# Patient Record
Sex: Female | Born: 1960 | Race: White | Hispanic: No | Marital: Married | Smoking: Former smoker
Health system: Southern US, Community
[De-identification: ages and names within clinical notes are randomized; demographics above are authoritative.]

## PROBLEM LIST (undated history)

## (undated) DIAGNOSIS — E079 Disorder of thyroid, unspecified: Secondary | ICD-10-CM

## (undated) DIAGNOSIS — S6990XA Unspecified injury of unspecified wrist, hand and finger(s), initial encounter: Secondary | ICD-10-CM

## (undated) DIAGNOSIS — E119 Type 2 diabetes mellitus without complications: Secondary | ICD-10-CM

## (undated) DIAGNOSIS — M109 Gout, unspecified: Secondary | ICD-10-CM

## (undated) DIAGNOSIS — I10 Essential (primary) hypertension: Secondary | ICD-10-CM

## (undated) HISTORY — PX: TONSILLECTOMY: SUR1361

## (undated) HISTORY — PX: BREAST SURGERY: SHX581

## (undated) HISTORY — DX: Gout, unspecified: M10.9

## (undated) HISTORY — DX: Unspecified injury of unspecified wrist, hand and finger(s), initial encounter: S69.90XA

## (undated) HISTORY — DX: Disorder of thyroid, unspecified: E07.9

## (undated) HISTORY — PX: ABDOMINAL HYSTERECTOMY: SHX81

---

## 2008-11-26 ENCOUNTER — Ambulatory Visit: Payer: Self-pay | Admitting: Internal Medicine

## 2009-02-10 ENCOUNTER — Ambulatory Visit: Payer: Self-pay | Admitting: Surgery

## 2009-05-08 ENCOUNTER — Ambulatory Visit: Payer: Self-pay | Admitting: Surgery

## 2009-09-08 ENCOUNTER — Ambulatory Visit: Payer: Self-pay | Admitting: Surgery

## 2010-02-18 ENCOUNTER — Ambulatory Visit: Payer: Self-pay | Admitting: Surgery

## 2011-08-16 ENCOUNTER — Ambulatory Visit: Payer: Self-pay | Admitting: Internal Medicine

## 2011-08-16 LAB — FERRITIN: Ferritin (ARMC): 117 ng/mL (ref 8–388)

## 2011-08-16 LAB — CBC CANCER CENTER
Basophil #: 0.1 x10 3/mm (ref 0.0–0.1)
Eosinophil #: 0.2 x10 3/mm (ref 0.0–0.7)
Eosinophil %: 1.6 %
HCT: 49.3 % — ABNORMAL HIGH (ref 35.0–47.0)
HGB: 16.2 g/dL — ABNORMAL HIGH (ref 12.0–16.0)
Lymphocyte #: 1.9 x10 3/mm (ref 1.0–3.6)
Lymphocyte %: 15 %
MCH: 31 pg (ref 26.0–34.0)
MCHC: 32.9 g/dL (ref 32.0–36.0)
Monocyte #: 1.2 x10 3/mm — ABNORMAL HIGH (ref 0.2–0.9)
Neutrophil #: 9.3 x10 3/mm — ABNORMAL HIGH (ref 1.4–6.5)
Neutrophil %: 73.6 %
Platelet: 192 x10 3/mm (ref 150–440)
RBC: 5.25 10*6/uL — ABNORMAL HIGH (ref 3.80–5.20)
RDW: 13.9 % (ref 11.5–14.5)

## 2011-08-16 LAB — IRON AND TIBC
Iron Bind.Cap.(Total): 434 ug/dL (ref 250–450)
Iron: 97 ug/dL (ref 50–170)
Unbound Iron-Bind.Cap.: 337 ug/dL

## 2011-08-17 ENCOUNTER — Ambulatory Visit: Payer: Self-pay | Admitting: Internal Medicine

## 2011-08-23 LAB — CANCER CENTER HEMATOCRIT: HCT: 45.7 % (ref 35.0–47.0)

## 2011-09-17 ENCOUNTER — Ambulatory Visit: Payer: Self-pay | Admitting: Internal Medicine

## 2011-09-20 LAB — CBC CANCER CENTER
Basophil %: 0.9 %
Eosinophil #: 0.2 x10 3/mm (ref 0.0–0.7)
Eosinophil %: 1.4 %
HCT: 46.3 % (ref 35.0–47.0)
HGB: 15.5 g/dL (ref 12.0–16.0)
Lymphocyte #: 2.7 x10 3/mm (ref 1.0–3.6)
MCH: 31.3 pg (ref 26.0–34.0)
MCHC: 33.4 g/dL (ref 32.0–36.0)
MCV: 94 fL (ref 80–100)
Monocyte #: 1.1 x10 3/mm — ABNORMAL HIGH (ref 0.2–0.9)
Neutrophil #: 6.7 x10 3/mm — ABNORMAL HIGH (ref 1.4–6.5)
Platelet: 243 x10 3/mm (ref 150–440)
RBC: 4.95 10*6/uL (ref 3.80–5.20)

## 2011-10-17 ENCOUNTER — Ambulatory Visit: Payer: Self-pay | Admitting: Internal Medicine

## 2011-10-18 LAB — CBC CANCER CENTER
Basophil %: 0.8 %
Eosinophil %: 2.1 %
HCT: 41.7 % (ref 35.0–47.0)
Lymphocyte #: 1.6 x10 3/mm (ref 1.0–3.6)
Lymphocyte %: 16.2 %
MCH: 31.2 pg (ref 26.0–34.0)
MCV: 93 fL (ref 80–100)
Monocyte #: 1.1 x10 3/mm — ABNORMAL HIGH (ref 0.2–0.9)
Platelet: 231 x10 3/mm (ref 150–440)
RDW: 13 % (ref 11.5–14.5)

## 2011-11-17 ENCOUNTER — Ambulatory Visit: Payer: Self-pay | Admitting: Internal Medicine

## 2013-01-23 ENCOUNTER — Telehealth: Payer: Self-pay | Admitting: *Deleted

## 2013-01-23 ENCOUNTER — Encounter: Payer: Self-pay | Admitting: *Deleted

## 2013-01-23 ENCOUNTER — Ambulatory Visit: Payer: Self-pay | Admitting: Podiatry

## 2013-01-23 NOTE — Telephone Encounter (Signed)
May refill once until she follows up with [place].

## 2013-01-23 NOTE — Telephone Encounter (Signed)
RECEIVED FAX FOR PRESCRIPTION REFILL FOR AUGMENTIN 875MG 

## 2013-01-24 MED ORDER — AMOXICILLIN-POT CLAVULANATE 875-125 MG PO TABS: 1.0000 | ORAL_TABLET | Freq: Two times a day (BID) | ORAL | Status: DC

## 2013-01-24 NOTE — Telephone Encounter (Signed)
RX SENT ELECTRONICALLY  

## 2013-02-06 ENCOUNTER — Encounter: Payer: Self-pay | Admitting: Podiatry

## 2013-02-06 ENCOUNTER — Ambulatory Visit (INDEPENDENT_AMBULATORY_CARE_PROVIDER_SITE_OTHER): Payer: BC Managed Care – PPO | Admitting: Podiatry

## 2013-02-06 VITALS — BP 136/73 | HR 93 | Resp 16 | Ht 65.0 in | Wt 240.0 lb

## 2013-02-06 DIAGNOSIS — L03039 Cellulitis of unspecified toe: Secondary | ICD-10-CM

## 2013-02-06 MED ORDER — CLINDAMYCIN HCL 150 MG PO CAPS: 150.0000 mg | ORAL_CAPSULE | Freq: Three times a day (TID) | ORAL | Status: DC

## 2013-02-06 NOTE — Patient Instructions (Signed)
Discontinue Betadine and water soaks.  Start EPSOM salts and warm water twice daily for 20 minutes.  Continue cortisporin otic solution drops to each nail after soaking. Cover during the day and leave open and night.  Start Antibiotics and continue until finished.  We will follow up with you in three weeks.

## 2013-02-06 NOTE — Progress Notes (Signed)
Sarah Gilmore presents today for followup matrixectomy to the hallux bilateral. She states that they're still quite tender. She states that she has not been soaking them a regular basis. She was unable to come to her previous appointment due to our having to cancel her. She did take another round of antibiotics. However she has been without them for the past few days. He states that the right hallux is getting more red.  Objective: Vital signs are stable she is alert and oriented x3. Right hallux does demonstrate erythema no edema no cellulitis drainage or odor. Left demonstrates very nice matrixectomy's nice borders and no signs of infection.  Assessment: Slowly healing matrixectomy hallux bilateral.  Plan: We discussed etiology pathology conservative or surgical therapies. At this point I am prescribing clindamycin 150 mg 1 by mouth 3 times a day. She will continue to soak but in Epsom salts and water on a twice a day basis continued to apply the Cortisporin Otic. And I will followup with her in one month.

## 2013-02-27 ENCOUNTER — Ambulatory Visit (INDEPENDENT_AMBULATORY_CARE_PROVIDER_SITE_OTHER): Payer: BC Managed Care – PPO | Admitting: Podiatry

## 2013-02-27 ENCOUNTER — Encounter: Payer: Self-pay | Admitting: Podiatry

## 2013-02-27 VITALS — BP 131/66 | HR 89 | Resp 16 | Ht 65.0 in | Wt 240.0 lb

## 2013-02-27 DIAGNOSIS — L600 Ingrowing nail: Secondary | ICD-10-CM

## 2013-02-27 NOTE — Progress Notes (Signed)
Sarah Gilmore presents today concerned about a possible infection to toes #1 bilateral. She states that she's been soaking twice a day because she doesn't have the time do that. But she is concerned about the redness.  Objective: Vital signs are stable she is alert and oriented x3. Toes do demonstrate some mild erythema but appears LMB inflammation really not infection at this point.  Assessment: Well-healing matrixectomy hallux bilateral.  Plan: Continue to soak on a twice a day basis in Epsom salts and water no followup with her on an as-needed basis.

## 2013-06-19 ENCOUNTER — Other Ambulatory Visit: Payer: Self-pay | Admitting: Family Medicine

## 2013-06-19 LAB — BASIC METABOLIC PANEL
ANION GAP: 5 — AB (ref 7–16)
BUN: 9 mg/dL (ref 7–18)
CHLORIDE: 100 mmol/L (ref 98–107)
CO2: 29 mmol/L (ref 21–32)
Calcium, Total: 9.1 mg/dL (ref 8.5–10.1)
Creatinine: 0.7 mg/dL (ref 0.60–1.30)
EGFR (African American): >60
EGFR (Non-African Amer.): >60
Glucose: 231 mg/dL — ABNORMAL HIGH (ref 65–99)
OSMOLALITY: 274 (ref 275–301)
Potassium: 4.2 mmol/L (ref 3.5–5.1)
SODIUM: 134 mmol/L — AB (ref 136–145)

## 2013-06-19 LAB — LIPID PANEL
Cholesterol: 221 mg/dL — ABNORMAL HIGH (ref 0–200)
HDL Cholesterol: 37 mg/dL — ABNORMAL LOW (ref 40–60)
Ldl Cholesterol, Calc: 150 mg/dL — ABNORMAL HIGH (ref 0–100)
Triglycerides: 169 mg/dL (ref 0–200)
VLDL CHOLESTEROL, CALC: 34 mg/dL (ref 5–40)

## 2013-06-19 LAB — TSH: Thyroid Stimulating Horm: 18.4 u[IU]/mL — ABNORMAL HIGH

## 2013-06-19 LAB — HEMOGLOBIN A1C: Hemoglobin A1C: 8.7 % — ABNORMAL HIGH (ref 4.2–6.3)

## 2013-09-04 ENCOUNTER — Other Ambulatory Visit: Payer: Self-pay | Admitting: Family Medicine

## 2013-09-04 LAB — TSH: Thyroid Stimulating Horm: 9.17 u[IU]/mL — ABNORMAL HIGH

## 2013-11-28 ENCOUNTER — Other Ambulatory Visit: Payer: Self-pay

## 2013-11-28 LAB — TSH: THYROID STIMULATING HORM: 3.13 u[IU]/mL

## 2014-06-16 ENCOUNTER — Other Ambulatory Visit: Payer: Self-pay

## 2014-06-18 ENCOUNTER — Other Ambulatory Visit: Payer: Self-pay | Admitting: Family Medicine

## 2014-11-07 ENCOUNTER — Ambulatory Visit: Payer: Self-pay | Admitting: Family Medicine

## 2014-12-01 ENCOUNTER — Other Ambulatory Visit: Payer: Self-pay | Admitting: Family Medicine

## 2014-12-01 NOTE — Telephone Encounter (Signed)
Pt called states that she have staph infection again wanted to know if you would call in meds for this, pt also have appt on  August 31. Pt call back # is   [phone]

## 2014-12-02 MED ORDER — DOXYCYCLINE HYCLATE 100 MG PO TABS: 100.0000 mg | ORAL_TABLET | Freq: Two times a day (BID) | ORAL | Status: DC

## 2014-12-02 NOTE — Telephone Encounter (Signed)
Please call Sarah Gilmore to obtain the following information: where is the infection? Is it a boil/abscess? Ideally these do not need anti-biotics unless there is a large area of infection or high risk of transmitting them to others- works in healthcare, works with athletes, Catering manager. She can use warm compresses 3-4 times daily to try and drain and open the abscess. If warm compresses are not working, she will needed to be seen for the area to be lanced and drained.  Thanks! [place]

## 2014-12-02 NOTE — Telephone Encounter (Signed)
Patient notified of abx sent to her [facility] pharmacy.

## 2014-12-02 NOTE — Telephone Encounter (Signed)
In talking more with Sarah Gilmore- pt reports her husband is frequently admitted to hospital. She noticed boil in previous MRSA site after visiting in hospital.  I encourage warm compresses to have boil burst and heal to draw out infection. Given risk to husband- she does meet antibiotic criteria. She can take doxycycline  BID for 10 days. I will send it to the pharmacy on file. Please let her know.

## 2014-12-02 NOTE — Telephone Encounter (Signed)
Patient was informed of suggestions from A.Krebs. 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 She just wanted abx called in b/c Dr. Juanetta Gosling gave her one in Feb 2016. The boil is on the same rt forearm as the last time. I told her that she needs to do this to try and get the boil to bust. She does not want to come in earlier than appt previously scheduled.

## 2014-12-17 ENCOUNTER — Encounter: Payer: Self-pay | Admitting: Family Medicine

## 2014-12-17 ENCOUNTER — Ambulatory Visit (INDEPENDENT_AMBULATORY_CARE_PROVIDER_SITE_OTHER): Payer: BLUE CROSS/BLUE SHIELD | Admitting: Family Medicine

## 2014-12-17 VITALS — BP 109/71 | HR 76 | Resp 16 | Ht 65.0 in | Wt 253.8 lb

## 2014-12-17 DIAGNOSIS — E039 Hypothyroidism, unspecified: Secondary | ICD-10-CM | POA: Insufficient documentation

## 2014-12-17 DIAGNOSIS — E669 Obesity, unspecified: Secondary | ICD-10-CM

## 2014-12-17 DIAGNOSIS — R899 Unspecified abnormal finding in specimens from other organs, systems and tissues: Secondary | ICD-10-CM | POA: Insufficient documentation

## 2014-12-17 DIAGNOSIS — R748 Abnormal levels of other serum enzymes: Secondary | ICD-10-CM | POA: Diagnosis not present

## 2014-12-17 DIAGNOSIS — E119 Type 2 diabetes mellitus without complications: Secondary | ICD-10-CM | POA: Diagnosis not present

## 2014-12-17 DIAGNOSIS — E038 Other specified hypothyroidism: Secondary | ICD-10-CM

## 2014-12-17 LAB — POCT GLYCOSYLATED HEMOGLOBIN (HGB A1C): Hemoglobin A1C: 6.4

## 2014-12-17 NOTE — Progress Notes (Signed)
Name: Sarah Gilmore   MRN: [id number]    DOB: 01-26-1961   Date:12/17/2014       Progress Note  Subjective  Chief Complaint  Chief Complaint  Patient presents with  . Diabetes    HPI  HEee for f/u of T2DM, hypothyroidism, obesity, elevated liver enzymes..  Taking meds.  She has no specific c/o, except fatigue.  She has not made much attempt to lose weight.  No problem-specific assessment & plan notes found for this encounter.   Past Medical History  Diagnosis Date  . Thyroid disease   . Gout     Social History  Substance Use Topics  . Smoking status: Former Smoker    Types: Cigarettes    Quit date: 08/17/2001  . Smokeless tobacco: Never Used  . Alcohol Use: Yes     Comment: SOCIAL      Current outpatient prescriptions:  .  levothyroxine (SYNTHROID, LEVOTHROID) 125 MCG tablet, , Disp: , Rfl: 2 .  lisinopril (PRINIVIL,ZESTRIL) 5 MG tablet, , Disp: , Rfl: 5 .  metFORMIN (GLUCOPHAGE) 500 MG tablet, , Disp: , Rfl: 5  No Known Allergies  Review of Systems  Constitutional: Positive for malaise/fatigue. Negative for fever, chills and weight loss.  HENT: Negative for hearing loss.   Eyes: Negative for blurred vision and double vision.  Respiratory: Negative for cough, sputum production, shortness of breath and wheezing.   Cardiovascular: Negative for chest pain, orthopnea and leg swelling.  Gastrointestinal: Negative for heartburn, nausea, vomiting, abdominal pain, diarrhea and blood in stool.  Genitourinary: Negative for dysuria, urgency and frequency.  Musculoskeletal: Negative for myalgias and joint pain.  Skin: Negative for rash.  Neurological: Negative for dizziness, sensory change, focal weakness, weakness and headaches.      Objective  Filed Vitals:   12/17/14 1619  BP: 109/71  Pulse: 76  Resp: 16  Height:  (1.651 m)  Weight: 253 lb 12.8 oz (115.123 kg)     Physical Exam  Constitutional: She is oriented to person, place, and time and  well-developed, well-nourished, and in no distress. No distress.  HENT:  Head: Normocephalic and atraumatic.  Eyes: Conjunctivae and EOM are normal. Pupils are equal, round, and reactive to light. No scleral icterus.  Neck: Normal range of motion. Neck supple. Carotid bruit is not present. No thyromegaly present.  Cardiovascular: Normal rate, regular rhythm, normal heart sounds and intact distal pulses.  Exam reveals no gallop and no friction rub.   No murmur heard. Pulmonary/Chest: Effort normal and breath sounds normal. No respiratory distress. She has no wheezes. She has no rales.  Abdominal: Bowel sounds are normal. She exhibits no distension, no abdominal bruit and no mass. There is no tenderness.  Musculoskeletal: She exhibits no edema.  Lymphadenopathy:    She has no cervical adenopathy.  Neurological: She is alert and oriented to person, place, and time.  Vitals reviewed.     No results found for this or any previous visit (from the past 2160 hour(s)).   Assessment & Plan  1. Type 2 diabetes mellitus without complication  - POCT HgB A1C -6.4  2. Elevated liver enzymes  - Comprehensive Metabolic Panel (CMET)  3. Other specified hypothyroidism  - TSH  4. Obesity

## 2014-12-17 NOTE — Patient Instructions (Addendum)
Discussed calorie reduction and weight loss.  Continue current meds.

## 2014-12-22 ENCOUNTER — Other Ambulatory Visit
Admission: RE | Admit: 2014-12-22 | Discharge: 2014-12-22 | Disposition: A | Discharge status: Home / Self Care | Payer: BLUE CROSS/BLUE SHIELD | Source: Ambulatory Visit | Attending: Family Medicine | Admitting: Family Medicine

## 2014-12-22 DIAGNOSIS — R748 Abnormal levels of other serum enzymes: Secondary | ICD-10-CM | POA: Insufficient documentation

## 2014-12-22 DIAGNOSIS — E038 Other specified hypothyroidism: Secondary | ICD-10-CM | POA: Insufficient documentation

## 2014-12-22 LAB — COMPREHENSIVE METABOLIC PANEL
ALT: 100 U/L — ABNORMAL HIGH (ref 14–54)
ANION GAP: 8 (ref 5–15)
AST: 88 U/L — AB (ref 15–41)
Albumin: 4.2 g/dL (ref 3.5–5.0)
Alkaline Phosphatase: 72 U/L (ref 38–126)
BILIRUBIN TOTAL: 0.8 mg/dL (ref 0.3–1.2)
BUN: 13 mg/dL (ref 6–20)
CHLORIDE: 103 mmol/L (ref 101–111)
CO2: 27 mmol/L (ref 22–32)
Calcium: 9.5 mg/dL (ref 8.9–10.3)
Creatinine, Ser: 0.6 mg/dL (ref 0.44–1.00)
Glucose, Bld: 107 mg/dL — ABNORMAL HIGH (ref 65–99)
POTASSIUM: 4.5 mmol/L (ref 3.5–5.1)
Sodium: 138 mmol/L (ref 135–145)
TOTAL PROTEIN: 7.9 g/dL (ref 6.5–8.1)

## 2014-12-22 LAB — TSH: TSH: 2.296 u[IU]/mL (ref 0.350–4.500)

## 2015-02-12 ENCOUNTER — Other Ambulatory Visit: Payer: Self-pay | Admitting: Family Medicine

## 2015-02-12 DIAGNOSIS — Z1231 Encounter for screening mammogram for malignant neoplasm of breast: Secondary | ICD-10-CM

## 2015-02-17 ENCOUNTER — Ambulatory Visit: Payer: BLUE CROSS/BLUE SHIELD | Attending: Family Medicine

## 2015-03-23 ENCOUNTER — Encounter: Payer: Self-pay | Admitting: Family Medicine

## 2015-03-23 ENCOUNTER — Ambulatory Visit (INDEPENDENT_AMBULATORY_CARE_PROVIDER_SITE_OTHER): Payer: BLUE CROSS/BLUE SHIELD | Admitting: Family Medicine

## 2015-03-23 VITALS — BP 117/75 | HR 87 | Resp 16 | Ht 65.0 in | Wt 257.2 lb

## 2015-03-23 DIAGNOSIS — E119 Type 2 diabetes mellitus without complications: Secondary | ICD-10-CM | POA: Insufficient documentation

## 2015-03-23 DIAGNOSIS — R748 Abnormal levels of other serum enzymes: Secondary | ICD-10-CM | POA: Diagnosis not present

## 2015-03-23 DIAGNOSIS — L918 Other hypertrophic disorders of the skin: Secondary | ICD-10-CM

## 2015-03-23 LAB — POCT GLYCOSYLATED HEMOGLOBIN (HGB A1C): Hemoglobin A1C: 6.7

## 2015-03-23 NOTE — Progress Notes (Signed)
Name: Sarah Gilmore   MRN: [id number]    DOB: 10/10/1960   Date:03/23/2015       Progress Note  Subjective  Chief Complaint  Chief Complaint  Patient presents with  . Diabetes    6.4 on 12/17/2014    HPI Here for f/u of DM.  Not following diet well recently.  Eating more sweets recently.  Otherwise feels well.  No problem-specific assessment & plan notes found for this encounter.   Past Medical History  Diagnosis Date  . Thyroid disease   . Gout     Past Surgical History  Procedure Laterality Date  . Breast surgery Bilateral   . Abdominal hysterectomy    . Tonsillectomy      History reviewed. No pertinent family history.  Social History   Social History  . Marital Status: Married    Spouse Name: N/A  . Number of Children: N/A  . Years of Education: N/A   Occupational History  . Not on file.   Social History Main Topics  . Smoking status: Former Smoker    Types: Cigarettes    Quit date: 08/17/2001  . Smokeless tobacco: Never Used  . Alcohol Use: Yes     Comment: SOCIAL   . Drug Use: No  . Sexual Activity: Not on file   Other Topics Concern  . Not on file   Social History Narrative     Current outpatient prescriptions:  .  levothyroxine (SYNTHROID, LEVOTHROID) 125 MCG tablet, , Disp: , Rfl: 2 .  lisinopril (PRINIVIL,ZESTRIL) 5 MG tablet, , Disp: , Rfl: 5 .  metFORMIN (GLUCOPHAGE) 500 MG tablet, , Disp: , Rfl: 5  Not on File   Review of Systems  Constitutional: Negative for fever, chills, weight loss and malaise/fatigue.  HENT: Negative for hearing loss.   Eyes: Negative for blurred vision and double vision.  Respiratory: Negative for cough, shortness of breath and wheezing.   Cardiovascular: Negative for chest pain, palpitations and leg swelling.  Gastrointestinal: Negative for heartburn, abdominal pain and blood in stool.  Genitourinary: Negative for dysuria, urgency and frequency.  Musculoskeletal: Negative for myalgias and joint pain.   Skin: Negative for rash.  Neurological: Negative for dizziness, weakness and headaches.      Objective  Filed Vitals:   03/23/15 1601  BP: 117/75  Pulse: 87  Resp: 16  Height: 5\' 5"  (1.651 m)  Weight: 257 lb 3.2 oz (116.665 kg)    Physical Exam  Constitutional: She is oriented to person, place, and time and well-developed, well-nourished, and in no distress. No distress.  HENT:  Head: Normocephalic and atraumatic.  Eyes: Conjunctivae and EOM are normal. Pupils are equal, round, and reactive to light. No scleral icterus.  Neck: Normal range of motion. Neck supple. Carotid bruit is not present. No thyromegaly present.  Cardiovascular: Normal rate, regular rhythm and normal heart sounds.  Exam reveals no gallop and no friction rub.   No murmur heard. Pulmonary/Chest: Effort normal and breath sounds normal. No respiratory distress. She has no wheezes. She has no rales.  Abdominal: Soft. Bowel sounds are normal. She exhibits no distension, no abdominal bruit and no mass. There is no tenderness.  Musculoskeletal: She exhibits no edema.  Lymphadenopathy:    She has no cervical adenopathy.  Neurological: She is alert and oriented to person, place, and time.  Skin:  Multiple skin tags in each axilla.       Recent Results (from the past 2160 hour(s))  POCT HgB A1C  Status: Abnormal   Collection Time: 03/23/15  4:07 PM  Result Value Ref Range   Hemoglobin A1C 6.7      Assessment & Plan  Problem List Items Addressed This Visit      Endocrine   DM (diabetes mellitus) (HCC) - Primary   Relevant Orders   POCT HgB A1C (Completed)   Comprehensive Metabolic Panel (CMET)     Other   Elevated liver enzymes   Relevant Orders   Comprehensive Metabolic Panel (CMET)    Other Visit Diagnoses    Cutaneous skin tags        Relevant Orders    Ambulatory referral to Dermatology       No orders of the defined types were placed in this encounter.   1. Type 2 diabetes  mellitus without complication, unspecified long term insulin use status (HCC)  - POCT HgB A1C-6.8 - Comprehensive Metabolic Panel (CMET)  2. Elevated liver enzymes  - Comprehensive Metabolic Panel (CMET)  3. Cutaneous skin tags  - Ambulatory referral to Dermatology

## 2015-03-25 ENCOUNTER — Other Ambulatory Visit
Admission: RE | Admit: 2015-03-25 | Discharge: 2015-03-25 | Disposition: A | Discharge status: Home / Self Care | Payer: BLUE CROSS/BLUE SHIELD | Source: Ambulatory Visit | Attending: Family Medicine | Admitting: Family Medicine

## 2015-03-25 ENCOUNTER — Telehealth: Payer: Self-pay | Admitting: *Deleted

## 2015-03-25 DIAGNOSIS — R748 Abnormal levels of other serum enzymes: Secondary | ICD-10-CM | POA: Insufficient documentation

## 2015-03-25 LAB — COMPREHENSIVE METABOLIC PANEL
ALBUMIN: 3.7 g/dL (ref 3.5–5.0)
ALK PHOS: 79 U/L (ref 38–126)
ALT: 106 U/L — AB (ref 14–54)
ANION GAP: 4 — AB (ref 5–15)
AST: 85 U/L — ABNORMAL HIGH (ref 15–41)
BUN: 20 mg/dL (ref 6–20)
CALCIUM: 9 mg/dL (ref 8.9–10.3)
CHLORIDE: 105 mmol/L (ref 101–111)
CO2: 29 mmol/L (ref 22–32)
CREATININE: 0.58 mg/dL (ref 0.44–1.00)
GFR calc non Af Amer: >60 mL/min (ref >60)
Glucose, Bld: 155 mg/dL — ABNORMAL HIGH (ref 65–99)
Potassium: 4.3 mmol/L (ref 3.5–5.1)
SODIUM: 138 mmol/L (ref 135–145)
Total Bilirubin: 0.8 mg/dL (ref 0.3–1.2)
Total Protein: 7.4 g/dL (ref 6.5–8.1)

## 2015-03-25 NOTE — Telephone Encounter (Signed)
Call her and set up an appointment to see me for face to face discussion about her abnormal liver enzymes.

## 2015-03-25 NOTE — Telephone Encounter (Signed)
[facility] was not able to add on Hep C. Liver Ultrasound was scheduled, patient declined US. She does not want to go back and have blood drawn either.[place]

## 2015-03-26 NOTE — Telephone Encounter (Signed)
Left detailed message noted below.Sarah Gilmore

## 2015-03-30 ENCOUNTER — Ambulatory Visit: Payer: BLUE CROSS/BLUE SHIELD

## 2015-04-01 ENCOUNTER — Ambulatory Visit: Payer: BLUE CROSS/BLUE SHIELD

## 2015-04-08 ENCOUNTER — Encounter: Payer: Self-pay | Admitting: *Deleted

## 2015-04-15 ENCOUNTER — Telehealth: Payer: Self-pay | Admitting: *Deleted

## 2015-04-15 NOTE — Telephone Encounter (Signed)
Patient was given the option between [facility] or [facility]. [facility] can see patient in February but [facility] can't see patient until 07/09/15. Patient wishes to go to sooner appointment.

## 2015-05-07 ENCOUNTER — Telehealth: Payer: Self-pay | Admitting: Family Medicine

## 2015-05-07 DIAGNOSIS — E119 Type 2 diabetes mellitus without complications: Secondary | ICD-10-CM

## 2015-05-07 MED ORDER — GLUCOSE BLOOD VI STRP: ORAL_STRIP | Status: AC

## 2015-05-07 NOTE — Telephone Encounter (Signed)
rx sent

## 2015-05-07 NOTE — Telephone Encounter (Signed)
Pt needs a new prescription for Contour Next test strips sent to [facility].

## 2015-05-14 ENCOUNTER — Ambulatory Visit: Payer: BLUE CROSS/BLUE SHIELD | Admitting: Family Medicine

## 2015-05-27 ENCOUNTER — Telehealth: Payer: Self-pay | Admitting: Family Medicine

## 2015-05-27 NOTE — Telephone Encounter (Signed)
Left detailed message.   

## 2015-05-27 NOTE — Telephone Encounter (Signed)
Pt asked for dr's name and address of dermatologist for appt scheduled on the 22nd.  She said you could just leaved a vm [phone]

## 2015-06-08 ENCOUNTER — Encounter: Payer: Self-pay | Admitting: Family Medicine

## 2015-06-08 ENCOUNTER — Ambulatory Visit (INDEPENDENT_AMBULATORY_CARE_PROVIDER_SITE_OTHER): Payer: BLUE CROSS/BLUE SHIELD | Admitting: Family Medicine

## 2015-06-08 VITALS — BP 135/75 | HR 73 | Resp 16 | Ht 65.0 in | Wt 263.4 lb

## 2015-06-08 DIAGNOSIS — E119 Type 2 diabetes mellitus without complications: Secondary | ICD-10-CM

## 2015-06-08 DIAGNOSIS — I10 Essential (primary) hypertension: Secondary | ICD-10-CM

## 2015-06-08 DIAGNOSIS — E038 Other specified hypothyroidism: Secondary | ICD-10-CM

## 2015-06-08 MED ORDER — METFORMIN HCL 1000 MG PO TABS: 1000.0000 mg | ORAL_TABLET | Freq: Two times a day (BID) | ORAL | Status: DC

## 2015-06-08 NOTE — Progress Notes (Signed)
Name: Sarah Gilmore   MRN: [id number]    DOB: 03-13-1961   Date:06/08/2015       Progress Note  Subjective  Chief Complaint  Chief Complaint  Patient presents with  . Hypertension    needs note not to wear steel toe boots due to foot issues. She also wants ref to bariatric clinic.   . Diabetes    Hypertension Associated symptoms include shortness of breath (with activity). Pertinent negatives include no blurred vision, chest pain, headaches, malaise/fatigue or palpitations.  Diabetes Pertinent negatives for hypoglycemia include no dizziness, headaches or tremors. Pertinent negatives for diabetes include no blurred vision, no chest pain, no weakness and no weight loss.   Here for f/u of HBP and DM.  Also hypothyroid.   Taking all meds as directed.  C/o feet swelling and painful when she wears steel toed boots/shoes at work.  Needs note not to wear steel toes.  BSs at home 120s-130s fasting.  Checks several days a week.    Says that she cannot eat less than she is eating.  She is still gaining weight and getting more and more SOB . No problem-specific assessment & plan notes found for this encounter.   Past Medical History  Diagnosis Date  . Thyroid disease   . Gout   . Wrist injury     Past Surgical History  Procedure Laterality Date  . Breast surgery Bilateral   . Abdominal hysterectomy    . Tonsillectomy      Family History  Problem Relation Age of Onset  . Diabetes Father   . Hypertension Father     Social History   Social History  . Marital Status: Married    Spouse Name: N/A  . Number of Children: N/A  . Years of Education: N/A   Occupational History  . Not on file.   Social History Main Topics  . Smoking status: Former Smoker    Types: Cigarettes    Quit date: 08/17/2001  . Smokeless tobacco: Never Used  . Alcohol Use: Yes     Comment: SOCIAL   . Drug Use: No  . Sexual Activity: Not on file   Other Topics Concern  . Not on file   Social  History Narrative     Current outpatient prescriptions:  .  glucose blood (BAYER CONTOUR NEXT TEST) test strip, Use as instructed, Disp: 100 each, Rfl: 12 .  levothyroxine (SYNTHROID, LEVOTHROID) 125 MCG tablet, , Disp: , Rfl: 2 .  lisinopril (PRINIVIL,ZESTRIL) 5 MG tablet, , Disp: , Rfl: 5 .  metFORMIN (GLUCOPHAGE) 1000 MG tablet, Take 1 tablet (1,000 mg total) by mouth 2 (two) times daily with a meal., Disp: 60 tablet, Rfl: 12  Not on File   Review of Systems  Constitutional: Negative for fever, chills, weight loss and malaise/fatigue.  HENT: Negative for hearing loss.   Eyes: Negative for blurred vision and double vision.  Respiratory: Positive for shortness of breath (with activity). Negative for cough and wheezing.   Cardiovascular: Negative for chest pain, palpitations and leg swelling.  Gastrointestinal: Negative for heartburn, abdominal pain and constipation.  Genitourinary: Negative for dysuria, urgency and frequency.  Musculoskeletal: Negative for myalgias and joint pain.  Skin: Negative for rash.  Neurological: Negative for dizziness, tremors, weakness and headaches.  Psychiatric/Behavioral: Negative for depression.      Objective  Filed Vitals:   06/08/15 1621 06/08/15 1648  BP: 122/64 135/75  Pulse: 73   Resp: 16   Height: 5' 5"  (  1.651 m)   Weight: 263 lb 6.4 oz (119.477 kg)   SpO2: 91%     Physical Exam  Constitutional: She is oriented to person, place, and time and well-developed, well-nourished, and in no distress. No distress.  HENT:  Head: Normocephalic and atraumatic.  Eyes: Conjunctivae and EOM are normal. Pupils are equal, round, and reactive to light. No scleral icterus.  Neck: Normal range of motion. Neck supple. Carotid bruit is not present. No thyromegaly present.  Cardiovascular: Normal rate, regular rhythm and normal heart sounds.  Exam reveals no gallop and no friction rub.   No murmur heard. Pulmonary/Chest: Effort normal and breath sounds  normal. No respiratory distress. She has no wheezes. She has no rales.  Musculoskeletal: She exhibits no edema.  Lymphadenopathy:    She has no cervical adenopathy.  Neurological: She is alert and oriented to person, place, and time.  Vitals reviewed.      Recent Results (from the past 2160 hour(s))  POCT HgB A1C     Status: Abnormal   Collection Time: 03/23/15  4:07 PM  Result Value Ref Range   Hemoglobin A1C 6.7   Comprehensive metabolic panel     Status: Abnormal   Collection Time: 03/25/15  6:48 AM  Result Value Ref Range   Sodium 138 135 - 145 mmol/L   Potassium 4.3 3.5 - 5.1 mmol/L   Chloride 105 101 - 111 mmol/L   CO2 29 22 - 32 mmol/L   Glucose, Bld 155 (H) 65 - 99 mg/dL   BUN 20 6 - 20 mg/dL   Creatinine, Ser 0.58 0.44 - 1.00 mg/dL   Calcium 9.0 8.9 - 10.3 mg/dL   Total Protein 7.4 6.5 - 8.1 g/dL   Albumin 3.7 3.5 - 5.0 g/dL   AST 85 (H) 15 - 41 U/L   ALT 106 (H) 14 - 54 U/L   Alkaline Phosphatase 79 38 - 126 U/L   Total Bilirubin 0.8 0.3 - 1.2 mg/dL   GFR calc non Af Amer >60 >60 mL/min   GFR calc Af Amer >60 >60 mL/min    Comment: (NOTE) The eGFR has been calculated using the CKD EPI equation. This calculation has not been validated in all clinical situations. eGFR's persistently <60 mL/min signify possible Chronic Kidney Disease.    Anion gap 4 (L) 5 - 15     Assessment & Plan  Problem List Items Addressed This Visit      Cardiovascular and Mediastinum   HBP (high blood pressure) - Primary     Endocrine   Hypothyroidism   DM (diabetes mellitus) (HCC)   Relevant Medications   metFORMIN (GLUCOPHAGE) 1000 MG tablet      Meds ordered this encounter  Medications  . metFORMIN (GLUCOPHAGE) 1000 MG tablet    Sig: Take 1 tablet (1,000 mg total) by mouth 2 (two) times daily with a meal.    Dispense:  60 tablet    Refill:  12   1. Essential hypertension Cont. Lisinopril  2. Type 2 diabetes mellitus without complication, without long-term current  use of insulin (HCC)  - metFORMIN (GLUCOPHAGE) 1000 MG tablet; Take 1 tablet (1,000 mg total) by mouth 2 (two) times daily with a meal.  Dispense: 60 tablet; Refill: 12  3. Other specified hypothyroidism Cont. . Levothyroxine

## 2015-06-18 ENCOUNTER — Telehealth: Payer: Self-pay | Admitting: Family Medicine

## 2015-06-18 ENCOUNTER — Other Ambulatory Visit: Payer: Self-pay | Admitting: Family Medicine

## 2015-06-18 DIAGNOSIS — E119 Type 2 diabetes mellitus without complications: Secondary | ICD-10-CM

## 2015-06-18 MED ORDER — LISINOPRIL 5 MG PO TABS: 5.0000 mg | ORAL_TABLET | Freq: Every day | ORAL | Status: AC

## 2015-06-18 MED ORDER — METFORMIN HCL 1000 MG PO TABS: 1000.0000 mg | ORAL_TABLET | Freq: Two times a day (BID) | ORAL | Status: DC

## 2015-06-18 NOTE — Telephone Encounter (Signed)
Done.[place] 

## 2015-06-18 NOTE — Telephone Encounter (Signed)
Pt needs refills on metformin and lisinopril sent to [facility] [address]  Her call back number is [phone]

## 2015-07-23 ENCOUNTER — Telehealth: Payer: Self-pay | Admitting: Family Medicine

## 2015-07-23 ENCOUNTER — Other Ambulatory Visit: Payer: Self-pay | Admitting: Family Medicine

## 2015-07-23 DIAGNOSIS — E039 Hypothyroidism, unspecified: Secondary | ICD-10-CM

## 2015-07-23 DIAGNOSIS — R748 Abnormal levels of other serum enzymes: Secondary | ICD-10-CM

## 2015-07-23 DIAGNOSIS — E1142 Type 2 diabetes mellitus with diabetic polyneuropathy: Secondary | ICD-10-CM

## 2015-07-23 DIAGNOSIS — I10 Essential (primary) hypertension: Secondary | ICD-10-CM

## 2015-07-23 DIAGNOSIS — R899 Unspecified abnormal finding in specimens from other organs, systems and tissues: Secondary | ICD-10-CM

## 2015-07-23 NOTE — Telephone Encounter (Signed)
Pt had an US scheduled for December but she had to cancel.  She would like to get that rescheduled and also get a lab order.  Her call back number is [phone]

## 2015-07-23 NOTE — Telephone Encounter (Signed)
I guess she is refering to US of liver she needs.  Can schedule Liver US at her convenience.  Also can order CMP, CBC, Lipid panel to be done fasting.-jhj

## 2015-07-24 NOTE — Telephone Encounter (Signed)
Left message notifying patient she may have labs drawn. I need to know when she would like to have U/S done?

## 2015-08-03 ENCOUNTER — Ambulatory Visit: Payer: BLUE CROSS/BLUE SHIELD | Admitting: Family Medicine

## 2015-08-04 ENCOUNTER — Other Ambulatory Visit
Admission: RE | Admit: 2015-08-04 | Discharge: 2015-08-04 | Disposition: A | Discharge status: Home / Self Care | Payer: BLUE CROSS/BLUE SHIELD | Source: Ambulatory Visit | Attending: Family Medicine | Admitting: Family Medicine

## 2015-08-04 ENCOUNTER — Ambulatory Visit
Admission: RE | Admit: 2015-08-04 | Discharge: 2015-08-04 | Disposition: A | Discharge status: Home / Self Care | Payer: BLUE CROSS/BLUE SHIELD | Source: Ambulatory Visit | Attending: Family Medicine | Admitting: Family Medicine

## 2015-08-04 ENCOUNTER — Other Ambulatory Visit: Payer: Self-pay | Admitting: Family Medicine

## 2015-08-04 DIAGNOSIS — R748 Abnormal levels of other serum enzymes: Secondary | ICD-10-CM | POA: Diagnosis present

## 2015-08-04 DIAGNOSIS — R899 Unspecified abnormal finding in specimens from other organs, systems and tissues: Secondary | ICD-10-CM

## 2015-08-04 LAB — LIPID PANEL
CHOL/HDL RATIO: 3.9 ratio
Cholesterol: 211 mg/dL — ABNORMAL HIGH (ref 0–200)
HDL: 54 mg/dL (ref >40)
LDL CALC: 132 mg/dL — AB (ref 0–99)
Triglycerides: 127 mg/dL (ref <150)
VLDL: 25 mg/dL (ref 0–40)

## 2015-08-04 LAB — CBC WITH DIFFERENTIAL/PLATELET
BASOS ABS: 0.1 10*3/uL (ref 0–0.1)
BASOS PCT: 1 %
EOS ABS: 0.2 10*3/uL (ref 0–0.7)
Eosinophils Relative: 2 %
HCT: 43.3 % (ref 35.0–47.0)
HEMOGLOBIN: 14.1 g/dL (ref 12.0–16.0)
Lymphocytes Relative: 24 %
Lymphs Abs: 1.8 10*3/uL (ref 1.0–3.6)
MCH: 28.4 pg (ref 26.0–34.0)
MCHC: 32.6 g/dL (ref 32.0–36.0)
MCV: 87.1 fL (ref 80.0–100.0)
Monocytes Absolute: 0.8 10*3/uL (ref 0.2–0.9)
Monocytes Relative: 10 %
NEUTROS PCT: 63 %
Neutro Abs: 4.8 10*3/uL (ref 1.4–6.5)
Platelets: 131 10*3/uL — ABNORMAL LOW (ref 150–440)
RBC: 4.97 MIL/uL (ref 3.80–5.20)
RDW: 15.2 % — ABNORMAL HIGH (ref 11.5–14.5)
WBC: 7.7 10*3/uL (ref 3.6–11.0)

## 2015-08-04 LAB — COMPREHENSIVE METABOLIC PANEL
ALBUMIN: 3.4 g/dL — AB (ref 3.5–5.0)
ALK PHOS: 94 U/L (ref 38–126)
ALT: 97 U/L — AB (ref 14–54)
ANION GAP: 4 — AB (ref 5–15)
AST: 109 U/L — AB (ref 15–41)
BUN: 15 mg/dL (ref 6–20)
CALCIUM: 8.7 mg/dL — AB (ref 8.9–10.3)
CO2: 29 mmol/L (ref 22–32)
Chloride: 103 mmol/L (ref 101–111)
Creatinine, Ser: 0.47 mg/dL (ref 0.44–1.00)
GFR calc Af Amer: >60 mL/min (ref >60)
GFR calc non Af Amer: >60 mL/min (ref >60)
GLUCOSE: 149 mg/dL — AB (ref 65–99)
Potassium: 4 mmol/L (ref 3.5–5.1)
SODIUM: 136 mmol/L (ref 135–145)
Total Bilirubin: 0.7 mg/dL (ref 0.3–1.2)
Total Protein: 7.3 g/dL (ref 6.5–8.1)

## 2015-08-04 LAB — TSH: TSH: 6.44 u[IU]/mL — ABNORMAL HIGH (ref 0.350–4.500)

## 2015-08-04 MED ORDER — LEVOTHYROXINE SODIUM 137 MCG PO TABS: 137.0000 ug | ORAL_TABLET | Freq: Every day | ORAL | Status: DC

## 2015-09-03 ENCOUNTER — Telehealth: Payer: Self-pay | Admitting: Family Medicine

## 2015-09-03 NOTE — Telephone Encounter (Signed)
Left detailed message with recommendations.[place] 

## 2015-09-03 NOTE — Telephone Encounter (Signed)
  Shouldn't treat with diuretic until I see her to evaluate other things.  Recommend markedly reducing salt intake and resting with feet elevated.  Keep appt next week-jh

## 2015-09-03 NOTE — Telephone Encounter (Signed)
Pt said her feet and hands are swelling really bad.  She has an appt scheduled for Tuesday but she asked if there was something she could do or anything he could call in for her.  Please call [phone]

## 2015-09-08 ENCOUNTER — Ambulatory Visit: Payer: BLUE CROSS/BLUE SHIELD | Admitting: Family Medicine

## 2015-10-06 ENCOUNTER — Ambulatory Visit (INDEPENDENT_AMBULATORY_CARE_PROVIDER_SITE_OTHER): Payer: BLUE CROSS/BLUE SHIELD | Admitting: Family Medicine

## 2015-10-06 ENCOUNTER — Encounter: Payer: Self-pay | Admitting: Family Medicine

## 2015-10-06 VITALS — BP 135/60 | HR 77 | Temp 98.6°F | Resp 16 | Ht 65.0 in | Wt 274.0 lb

## 2015-10-06 DIAGNOSIS — E119 Type 2 diabetes mellitus without complications: Secondary | ICD-10-CM | POA: Diagnosis not present

## 2015-10-06 DIAGNOSIS — E034 Atrophy of thyroid (acquired): Secondary | ICD-10-CM | POA: Diagnosis not present

## 2015-10-06 DIAGNOSIS — E038 Other specified hypothyroidism: Secondary | ICD-10-CM | POA: Diagnosis not present

## 2015-10-06 DIAGNOSIS — I10 Essential (primary) hypertension: Secondary | ICD-10-CM | POA: Diagnosis not present

## 2015-10-06 DIAGNOSIS — R748 Abnormal levels of other serum enzymes: Secondary | ICD-10-CM

## 2015-10-06 DIAGNOSIS — E0800 Diabetes mellitus due to underlying condition with hyperosmolarity without nonketotic hyperglycemic-hyperosmolar coma (NKHHC): Secondary | ICD-10-CM

## 2015-10-06 LAB — POCT GLYCOSYLATED HEMOGLOBIN (HGB A1C)

## 2015-10-06 MED ORDER — CANAGLIFLOZIN 300 MG PO TABS: 300.0000 mg | ORAL_TABLET | Freq: Every day | ORAL | Status: DC

## 2015-10-06 NOTE — Progress Notes (Signed)
Name: Sarah Gilmore   MRN: [id number]    DOB: 1960/07/14   Date:10/06/2015       Progress Note  Subjective  Chief Complaint  Chief Complaint  Patient presents with  . Diabetes  . Hypothyroidism    HPI Here for f/u of DM.  She says limited BS readings are always <150.   But she has gained 11# since last visit and says she has to urinate "all the time" and her feet are swollen.    She increased her Levothyroxine to 137 mcg daily after last lab 2 months ago and needs recheck of this.  No problem-specific assessment & plan notes found for this encounter.   Past Medical History  Diagnosis Date  . Thyroid disease   . Gout   . Wrist injury     Past Surgical History  Procedure Laterality Date  . Breast surgery Bilateral   . Abdominal hysterectomy    . Tonsillectomy      Family History  Problem Relation Age of Onset  . Diabetes Father   . Hypertension Father     Social History   Social History  . Marital Status: Married    Spouse Name: N/A  . Number of Children: N/A  . Years of Education: N/A   Occupational History  . Not on file.   Social History Main Topics  . Smoking status: Former Smoker    Types: Cigarettes    Quit date: 08/17/2001  . Smokeless tobacco: Never Used  . Alcohol Use: Yes     Comment: SOCIAL   . Drug Use: No  . Sexual Activity: Not on file   Other Topics Concern  . Not on file   Social History Narrative     Current outpatient prescriptions:  .  glucose blood (BAYER CONTOUR NEXT TEST) test strip, Use as instructed, Disp: 100 each, Rfl: 12 .  levothyroxine (LEVOTHROID) 137 MCG tablet, Take 1 tablet (137 mcg total) by mouth daily before breakfast., Disp: 30 tablet, Rfl: 6 .  lisinopril (PRINIVIL,ZESTRIL) 5 MG tablet, Take 1 tablet (5 mg total) by mouth daily., Disp: 30 tablet, Rfl: 6 .  metFORMIN (GLUCOPHAGE) 1000 MG tablet, Take 1 tablet (1,000 mg total) by mouth 2 (two) times daily with a meal., Disp: 60 tablet, Rfl: 12 .   canagliflozin (INVOKANA) 300 MG TABS tablet, Take 1 tablet (300 mg total) by mouth daily before breakfast., Disp: 30 tablet, Rfl: 6  Not on File   Review of Systems  Constitutional: Negative for fever, chills, weight loss and malaise/fatigue.  HENT: Negative for hearing loss.   Eyes: Negative for blurred vision and double vision.  Respiratory: Negative for cough, shortness of breath and wheezing.   Cardiovascular: Positive for leg swelling. Negative for chest pain and palpitations.  Gastrointestinal: Negative for heartburn, abdominal pain and blood in stool.  Genitourinary: Positive for urgency and frequency. Negative for dysuria.  Skin: Negative for rash.  Neurological: Negative for dizziness, tremors, weakness and headaches.  Psychiatric/Behavioral: Negative for depression. The patient has insomnia.       Objective  Filed Vitals:   10/06/15 1603 10/06/15 1709  BP: 125/51 135/60  Pulse: 77   Temp: 98.6 F (37 C)   TempSrc: Oral   Resp: 16   Height: _0  (1.651 m)   Weight: 274 lb (124.286 kg)     Physical Exam  Constitutional: She is oriented to person, place, and time and well-developed, well-nourished, and in no distress. No distress.  HENT:  Head: Normocephalic and atraumatic.  Eyes: Conjunctivae and EOM are normal. Pupils are equal, round, and reactive to light. No scleral icterus.  Neck: Normal range of motion. Neck supple. Carotid bruit is not present. No thyromegaly present.  Cardiovascular: Normal rate, regular rhythm and normal heart sounds.  Exam reveals no gallop and no friction rub.   No murmur heard. Pulmonary/Chest: Effort normal and breath sounds normal. No respiratory distress. She has no wheezes. She has no rales.  Musculoskeletal: She exhibits edema (2+ pedal edema of bilateral lower exremities.).  Lymphadenopathy:    She has no cervical adenopathy.  Neurological: She is alert and oriented to person, place, and time.  Vitals  reviewed.      Recent Results (from the past 2160 hour(s))  Lipid panel     Status: Abnormal   Collection Time: 08/04/15  6:50 AM  Result Value Ref Range   Cholesterol 211 (H) 0 - 200 mg/dL   Triglycerides 127 <150 mg/dL   HDL 54 >40 mg/dL   Total CHOL/HDL Ratio 3.9 RATIO   VLDL 25 0 - 40 mg/dL   LDL Cholesterol 132 (H) 0 - 99 mg/dL    Comment:        Total Cholesterol/HDL:CHD Risk Coronary Heart Disease Risk Table                     Men   Women  1/2 Average Risk   3.4   3.3  Average Risk       5.0   4.4  2 X Average Risk   9.6   7.1  3 X Average Risk  23.4   11.0        Use the calculated Patient Ratio above and the CHD Risk Table to determine the patient's CHD Risk.        ATP III CLASSIFICATION (LDL):  <100     mg/dL   Optimal  100-129  mg/dL   Near or Above                    Optimal  130-159  mg/dL   Borderline  160-189  mg/dL   High  >190     mg/dL   Very High   CBC with Differential/Platelet     Status: Abnormal   Collection Time: 08/04/15  6:50 AM  Result Value Ref Range   WBC 7.7 3.6 - 11.0 K/uL   RBC 4.97 3.80 - 5.20 MIL/uL   Hemoglobin 14.1 12.0 - 16.0 g/dL   HCT 43.3 35.0 - 47.0 %   MCV 87.1 80.0 - 100.0 fL   MCH 28.4 26.0 - 34.0 pg   MCHC 32.6 32.0 - 36.0 g/dL   RDW 15.2 (H) 11.5 - 14.5 %   Platelets 131 (L) 150 - 440 K/uL   Neutrophils Relative % 63 %   Neutro Abs 4.8 1.4 - 6.5 K/uL   Lymphocytes Relative 24 %   Lymphs Abs 1.8 1.0 - 3.6 K/uL   Monocytes Relative 10 %   Monocytes Absolute 0.8 0.2 - 0.9 K/uL   Eosinophils Relative 2 %   Eosinophils Absolute 0.2 0 - 0.7 K/uL   Basophils Relative 1 %   Basophils Absolute 0.1 0 - 0.1 K/uL  Comprehensive metabolic panel     Status: Abnormal   Collection Time: 08/04/15  6:50 AM  Result Value Ref Range   Sodium 136 135 - 145 mmol/L   Potassium 4.0 3.5 - 5.1 mmol/L   Chloride  103 101 - 111 mmol/L   CO2 29 22 - 32 mmol/L   Glucose, Bld 149 (H) 65 - 99 mg/dL   BUN 15 6 - 20 mg/dL   Creatinine,  Ser 0.47 0.44 - 1.00 mg/dL   Calcium 8.7 (L) 8.9 - 10.3 mg/dL   Total Protein 7.3 6.5 - 8.1 g/dL   Albumin 3.4 (L) 3.5 - 5.0 g/dL   AST 109 (H) 15 - 41 U/L   ALT 97 (H) 14 - 54 U/L   Alkaline Phosphatase 94 38 - 126 U/L   Total Bilirubin 0.7 0.3 - 1.2 mg/dL   GFR calc non Af Amer >60 >60 mL/min   GFR calc Af Amer >60 >60 mL/min    Comment: (NOTE) The eGFR has been calculated using the CKD EPI equation. This calculation has not been validated in all clinical situations. eGFR's persistently <60 mL/min signify possible Chronic Kidney Disease.    Anion gap 4 (L) 5 - 15  TSH     Status: Abnormal   Collection Time: 08/04/15  6:50 AM  Result Value Ref Range   TSH 6.440 (H) 0.350 - 4.500 uIU/mL  POCT HgB A1C     Status: Abnormal   Collection Time: 10/06/15  4:55 PM  Result Value Ref Range   Hemoglobin A1C 7.4%      Assessment & Plan  Problem List Items Addressed This Visit      Cardiovascular and Mediastinum   HBP (high blood pressure)     Endocrine   Hypothyroidism   Relevant Orders   TSH   DM (diabetes mellitus) ([place])   Relevant Medications   canagliflozin (INVOKANA) 300 MG TABS tablet     Other   Elevated liver enzymes   Relevant Orders   Hepatic function panel    Other Visit Diagnoses    Diabetes mellitus without complication ([place])    -  Primary    Relevant Medications    canagliflozin (INVOKANA) 300 MG TABS tablet    Other Relevant Orders    POCT HgB A1C (Completed)       Meds ordered this encounter  Medications  . canagliflozin (INVOKANA) 300 MG TABS tablet    Sig: Take 1 tablet (300 mg total) by mouth daily before breakfast.    Dispense:  30 tablet    Refill:  6   1. Diabetes mellitus without complication ([place])  - POCT HgB A1C-7.4 - canagliflozin (INVOKANA) 300 MG TABS tablet; Take 1 tablet (300 mg total) by mouth daily before breakfast.  Dispense: 30 tablet; Refill: 6 Cont Metformin 2. Diabetes mellitus due to underlying condition with  hyperosmolarity without coma, unspecified long term insulin use status ([place])   3. Hypothyroidism due to acquired atrophy of thyroid Cont Levothyroxine - TSH  4. Essential hypertension Cont Lisinopril  5. Elevated liver enzymes  - Hepatic function panel

## 2015-10-13 LAB — HEPATIC FUNCTION PANEL
ALBUMIN: 3.7 g/dL (ref 3.6–5.1)
ALK PHOS: 84 U/L (ref 33–130)
ALT: 64 U/L — AB (ref 6–29)
AST: 56 U/L — AB (ref 10–35)
Bilirubin, Direct: 0.1 mg/dL (ref <=0.2)
Indirect Bilirubin: 0.5 mg/dL (ref 0.2–1.2)
Total Bilirubin: 0.6 mg/dL (ref 0.2–1.2)
Total Protein: 6.9 g/dL (ref 6.1–8.1)

## 2015-10-13 LAB — TSH: TSH: 4.95 mIU/L — ABNORMAL HIGH

## 2015-10-13 MED ORDER — LEVOTHYROXINE SODIUM 150 MCG PO TABS: 150.0000 ug | ORAL_TABLET | Freq: Every day | ORAL | Status: AC

## 2015-10-13 NOTE — Addendum Note (Signed)
Addended by: Alease FrameARTER, Marvell Stavola S on: 10/13/2015 12:09 PM   Modules accepted: Orders

## 2015-11-25 ENCOUNTER — Inpatient Hospital Stay
Admit: 2015-11-25 | Discharge: 2015-11-25 | Disposition: A | Discharge status: Home / Self Care | Payer: BLUE CROSS/BLUE SHIELD | Attending: Internal Medicine | Admitting: Internal Medicine

## 2015-11-25 ENCOUNTER — Emergency Department: Payer: BLUE CROSS/BLUE SHIELD

## 2015-11-25 ENCOUNTER — Inpatient Hospital Stay
Admission: EM | Admit: 2015-11-25 | Discharge: 2015-11-26 | DRG: 286 | Disposition: A | Discharge status: Home / Self Care | Payer: BLUE CROSS/BLUE SHIELD | Attending: Cardiovascular Disease | Admitting: Cardiovascular Disease

## 2015-11-25 DIAGNOSIS — Z79899 Other long term (current) drug therapy: Secondary | ICD-10-CM

## 2015-11-25 DIAGNOSIS — Z6841 Body Mass Index (BMI) 40.0 and over, adult: Secondary | ICD-10-CM | POA: Diagnosis not present

## 2015-11-25 DIAGNOSIS — E119 Type 2 diabetes mellitus without complications: Secondary | ICD-10-CM | POA: Diagnosis present

## 2015-11-25 DIAGNOSIS — I214 Non-ST elevation (NSTEMI) myocardial infarction: Secondary | ICD-10-CM | POA: Diagnosis present

## 2015-11-25 DIAGNOSIS — Z8249 Family history of ischemic heart disease and other diseases of the circulatory system: Secondary | ICD-10-CM

## 2015-11-25 DIAGNOSIS — Z833 Family history of diabetes mellitus: Secondary | ICD-10-CM | POA: Diagnosis not present

## 2015-11-25 DIAGNOSIS — Z7984 Long term (current) use of oral hypoglycemic drugs: Secondary | ICD-10-CM | POA: Diagnosis not present

## 2015-11-25 DIAGNOSIS — G4730 Sleep apnea, unspecified: Secondary | ICD-10-CM | POA: Diagnosis present

## 2015-11-25 DIAGNOSIS — I110 Hypertensive heart disease with heart failure: Principal | ICD-10-CM | POA: Diagnosis present

## 2015-11-25 DIAGNOSIS — I248 Other forms of acute ischemic heart disease: Secondary | ICD-10-CM | POA: Diagnosis present

## 2015-11-25 DIAGNOSIS — I5031 Acute diastolic (congestive) heart failure: Secondary | ICD-10-CM | POA: Diagnosis present

## 2015-11-25 DIAGNOSIS — Z87891 Personal history of nicotine dependence: Secondary | ICD-10-CM | POA: Diagnosis not present

## 2015-11-25 DIAGNOSIS — E079 Disorder of thyroid, unspecified: Secondary | ICD-10-CM | POA: Diagnosis present

## 2015-11-25 DIAGNOSIS — J9601 Acute respiratory failure with hypoxia: Secondary | ICD-10-CM | POA: Diagnosis present

## 2015-11-25 DIAGNOSIS — I2510 Atherosclerotic heart disease of native coronary artery without angina pectoris: Secondary | ICD-10-CM | POA: Diagnosis present

## 2015-11-25 HISTORY — DX: Type 2 diabetes mellitus without complications: E11.9

## 2015-11-25 HISTORY — DX: Essential (primary) hypertension: I10

## 2015-11-25 LAB — CBC WITH DIFFERENTIAL/PLATELET
BASOS PCT: 1 %
Basophils Absolute: 0.1 10*3/uL (ref 0–0.1)
EOS ABS: 0.2 10*3/uL (ref 0–0.7)
Eosinophils Relative: 2 %
HEMATOCRIT: 40.2 % (ref 35.0–47.0)
HEMOGLOBIN: 12.6 g/dL (ref 12.0–16.0)
LYMPHS ABS: 1.5 10*3/uL (ref 1.0–3.6)
Lymphocytes Relative: 17 %
MCH: 23.4 pg — AB (ref 26.0–34.0)
MCHC: 31.3 g/dL — AB (ref 32.0–36.0)
MCV: 74.7 fL — ABNORMAL LOW (ref 80.0–100.0)
MONO ABS: 1 10*3/uL — AB (ref 0.2–0.9)
MONOS PCT: 11 %
Neutro Abs: 6.3 10*3/uL (ref 1.4–6.5)
Neutrophils Relative %: 69 %
Platelets: 145 10*3/uL — ABNORMAL LOW (ref 150–440)
RBC: 5.38 MIL/uL — ABNORMAL HIGH (ref 3.80–5.20)
RDW: 17.6 % — AB (ref 11.5–14.5)
WBC: 9 10*3/uL (ref 3.6–11.0)

## 2015-11-25 LAB — COMPREHENSIVE METABOLIC PANEL
ALK PHOS: 71 U/L (ref 38–126)
ALT: 47 U/L (ref 14–54)
ANION GAP: 6 (ref 5–15)
AST: 55 U/L — ABNORMAL HIGH (ref 15–41)
Albumin: 3.3 g/dL — ABNORMAL LOW (ref 3.5–5.0)
BUN: 15 mg/dL (ref 6–20)
CALCIUM: 8.6 mg/dL — AB (ref 8.9–10.3)
CO2: 30 mmol/L (ref 22–32)
CREATININE: 0.45 mg/dL (ref 0.44–1.00)
Chloride: 102 mmol/L (ref 101–111)
Glucose, Bld: 153 mg/dL — ABNORMAL HIGH (ref 65–99)
Potassium: 4.2 mmol/L (ref 3.5–5.1)
SODIUM: 138 mmol/L (ref 135–145)
Total Bilirubin: 0.8 mg/dL (ref 0.3–1.2)
Total Protein: 6.8 g/dL (ref 6.5–8.1)

## 2015-11-25 LAB — TROPONIN I
TROPONIN I: 0.09 ng/mL — AB (ref <0.03)
Troponin I: 0.1 ng/mL (ref <0.03)

## 2015-11-25 LAB — BRAIN NATRIURETIC PEPTIDE: B NATRIURETIC PEPTIDE 5: 229 pg/mL — AB (ref 0.0–100.0)

## 2015-11-25 LAB — GLUCOSE, CAPILLARY
Glucose-Capillary: 196 mg/dL — ABNORMAL HIGH (ref 65–99)
Glucose-Capillary: 115 mg/dL — ABNORMAL HIGH (ref 65–99)

## 2015-11-25 LAB — MRSA PCR SCREENING: MRSA by PCR: NEGATIVE

## 2015-11-25 LAB — TSH: TSH: 5.318 u[IU]/mL — AB (ref 0.350–4.500)

## 2015-11-25 MED ORDER — POLYETHYLENE GLYCOL 3350 17 G PO PACK: 17.0000 g | PACK | Freq: Every day | ORAL | Status: DC | PRN

## 2015-11-25 MED ORDER — BISACODYL 5 MG PO TBEC: 5.0000 mg | DELAYED_RELEASE_TABLET | Freq: Every day | ORAL | Status: DC | PRN

## 2015-11-25 MED ORDER — LEVOTHYROXINE SODIUM 75 MCG PO TABS
150.0000 ug | ORAL_TABLET | Freq: Every day | ORAL | Status: DC
  Administered 2015-11-26: 150 ug via ORAL
  Filled 2015-11-25: qty 3

## 2015-11-25 MED ORDER — ACETAMINOPHEN 650 MG RE SUPP: 650.0000 mg | Freq: Four times a day (QID) | RECTAL | Status: DC | PRN

## 2015-11-25 MED ORDER — ALBUTEROL SULFATE (2.5 MG/3ML) 0.083% IN NEBU: 2.5000 mg | INHALATION_SOLUTION | RESPIRATORY_TRACT | Status: DC | PRN

## 2015-11-25 MED ORDER — SODIUM CHLORIDE 0.9% FLUSH: 3.0000 mL | Freq: Two times a day (BID) | INTRAVENOUS | Status: DC

## 2015-11-25 MED ORDER — ATORVASTATIN CALCIUM 20 MG PO TABS
40.0000 mg | ORAL_TABLET | Freq: Every day | ORAL | Status: DC
  Administered 2015-11-25: 40 mg via ORAL
  Filled 2015-11-25: qty 2

## 2015-11-25 MED ORDER — SODIUM CHLORIDE 0.9% FLUSH
3.0000 mL | INTRAVENOUS | Status: DC | PRN
  Administered 2015-11-25: 3 mL via INTRAVENOUS
  Filled 2015-11-25: qty 3

## 2015-11-25 MED ORDER — INSULIN ASPART 100 UNIT/ML ~~LOC~~ SOLN
0.0000 [IU] | Freq: Three times a day (TID) | SUBCUTANEOUS | Status: DC
  Administered 2015-11-25: 3 [IU] via SUBCUTANEOUS
  Filled 2015-11-25: qty 3

## 2015-11-25 MED ORDER — ENOXAPARIN SODIUM 120 MG/0.8ML ~~LOC~~ SOLN
1.0000 mg/kg | Freq: Two times a day (BID) | SUBCUTANEOUS | Status: DC
  Administered 2015-11-25 – 2015-11-26 (×2): 120 mg via SUBCUTANEOUS
  Filled 2015-11-25 (×3): qty 0.8

## 2015-11-25 MED ORDER — ACETAMINOPHEN 325 MG PO TABS: 650.0000 mg | ORAL_TABLET | Freq: Four times a day (QID) | ORAL | Status: DC | PRN

## 2015-11-25 MED ORDER — SODIUM CHLORIDE 0.9 % IV SOLN: 250.0000 mL | INTRAVENOUS | Status: DC | PRN

## 2015-11-25 MED ORDER — ONDANSETRON HCL 4 MG/2ML IJ SOLN: 4.0000 mg | Freq: Four times a day (QID) | INTRAMUSCULAR | Status: DC | PRN

## 2015-11-25 MED ORDER — SODIUM CHLORIDE 0.9% FLUSH
3.0000 mL | Freq: Two times a day (BID) | INTRAVENOUS | Status: DC
  Administered 2015-11-25: 3 mL via INTRAVENOUS

## 2015-11-25 MED ORDER — ONDANSETRON HCL 4 MG PO TABS: 4.0000 mg | ORAL_TABLET | Freq: Four times a day (QID) | ORAL | Status: DC | PRN

## 2015-11-25 MED ORDER — ASPIRIN 81 MG PO CHEW
324.0000 mg | CHEWABLE_TABLET | Freq: Once | ORAL | Status: AC
  Administered 2015-11-25: 324 mg via ORAL
  Filled 2015-11-25: qty 4

## 2015-11-25 MED ORDER — FUROSEMIDE 10 MG/ML IJ SOLN
40.0000 mg | Freq: Once | INTRAMUSCULAR | Status: AC
Start: 2015-11-25 — End: 2015-11-25
  Administered 2015-11-25: 40 mg via INTRAVENOUS
  Filled 2015-11-25: qty 4

## 2015-11-25 MED ORDER — INSULIN ASPART 100 UNIT/ML ~~LOC~~ SOLN: 0.0000 [IU] | Freq: Every day | SUBCUTANEOUS | Status: DC

## 2015-11-25 MED ORDER — ASPIRIN EC 81 MG PO TBEC
81.0000 mg | DELAYED_RELEASE_TABLET | Freq: Every day | ORAL | Status: DC
  Administered 2015-11-26: 81 mg via ORAL
  Filled 2015-11-25: qty 1

## 2015-11-25 MED ORDER — NITROGLYCERIN 0.4 MG SL SUBL: 0.4000 mg | SUBLINGUAL_TABLET | SUBLINGUAL | Status: DC | PRN

## 2015-11-25 MED ORDER — METOPROLOL TARTRATE 25 MG PO TABS
12.5000 mg | ORAL_TABLET | Freq: Two times a day (BID) | ORAL | Status: DC
  Administered 2015-11-25 – 2015-11-26 (×2): 12.5 mg via ORAL
  Filled 2015-11-25 (×2): qty 1

## 2015-11-25 NOTE — ED Notes (Signed)
Pt placed on 2 LC [place] while in waiting room, oxygen now 95%

## 2015-11-25 NOTE — ED Notes (Signed)
Pt in via triage with complaints of sudden onset of chest pressure this morning while at work lasting approximately 10 minutes; pt reports shortness of breath with chest pain, denies any other accompanying symptoms.  Pt reports shortness of breath w/ BLE edema x a few months.  Pt reports drifting off to sleep frequently during the day.  Pt A/Ox4, MD at bedside at this time.

## 2015-11-25 NOTE — ED Notes (Signed)
Patient transported to X-ray 

## 2015-11-25 NOTE — ED Provider Notes (Signed)
[facility] Emergency Department Provider Note  ____________________________________________  Time seen: Approximately 12:12 PM  I have reviewed the triage vital signs and the nursing notes.   HISTORY  Chief Complaint Shortness of Breath   HPI Sarah Gilmore is a 55 y.o. female history of diabetes, hypertension, hypothyroidism, former smoking who presents for evaluation of chest pain. Patient reports a month of constant shortness of breath worse with exertion. Her husband reports that she snores a lot at night and also has had multiple episodes or she falls asleep in the middle of the day. She has a strong family history of ischemic heart disease. Patient used to be a smoker to 5 years ago. Today she was sitting at her job when she developed severe substernal chest pressure that she describes as an elephant sitting on her chest lasting about 8 minutes associated with severe shortness of breath and nausea. That episode has now resolved and she has no chest pain but continues to endorse the chronic shortness of breath she has had for a month. No wheezing, no fever, no cough. She has pitting edema of her lower extremities which is chronic. No prior history of blood clots. No recent travel or immobilization.  Past Medical History:  Diagnosis Date  . Diabetes mellitus without complication (HCC)   . Gout   . Hypertension   . Thyroid disease   . Wrist injury     Patient Active Problem List   Diagnosis Date Noted  . HBP (high blood pressure) 06/08/2015  . DM (diabetes mellitus) (HCC) 03/23/2015  . Elevated liver enzymes 12/17/2014  . Hypothyroidism 12/17/2014  . Abnormal laboratory test 12/17/2014    Past Surgical History:  Procedure Laterality Date  . ABDOMINAL HYSTERECTOMY    . BREAST SURGERY Bilateral   . TONSILLECTOMY      Prior to Admission medications   Medication Sig Start Date End Date Taking? Authorizing Provider  canagliflozin (INVOKANA) 300  MG TABS tablet Take 1 tablet (300 mg total) by mouth daily before breakfast. 10/06/15   Janeann Forehand., MD  glucose blood (BAYER CONTOUR NEXT TEST) test strip Use as instructed 05/07/15   Janeann Forehand., MD  levothyroxine (LEVOTHROID) 137 MCG tablet Take 1 tablet (137 mcg total) by mouth daily before breakfast. 08/04/15   Janeann Forehand., MD  levothyroxine (SYNTHROID, LEVOTHROID) 150 MCG tablet Take 1 tablet (150 mcg total) by mouth daily. 10/13/15   Janeann Forehand., MD  lisinopril (PRINIVIL,ZESTRIL) 5 MG tablet Take 1 tablet (5 mg total) by mouth daily. 06/18/15   Janeann Forehand., MD  metFORMIN (GLUCOPHAGE) 1000 MG tablet Take 1 tablet (1,000 mg total) by mouth 2 (two) times daily with a meal. 06/18/15   Janeann Forehand., MD    Allergies Review of patient's allergies indicates no known allergies.  Family History  Problem Relation Age of Onset  . Diabetes Father   . Hypertension Father     Social History Social History  Substance Use Topics  . Smoking status: Former Smoker    Types: Cigarettes    Quit date: 08/17/2001  . Smokeless tobacco: Never Used  . Alcohol use Yes     Comment: SOCIAL     Review of Systems  Constitutional: Negative for fever. Eyes: Negative for visual changes. ENT: Negative for sore throat. Cardiovascular: + chest pain. Respiratory: + shortness of breath. Gastrointestinal: Negative for abdominal pain, vomiting or diarrhea. Genitourinary: Negative for dysuria. Musculoskeletal: Negative  for back pain. Skin: Negative for rash. Neurological: Negative for headaches, weakness or numbness.  ____________________________________________   PHYSICAL EXAM:  VITAL SIGNS: ED Triage Vitals  Enc Vitals Group     BP 11/25/15 1120 (!) 148/92     Pulse Rate 11/25/15 1120 89     Resp 11/25/15 1120 20     Temp 11/25/15 1120 98.2 F (36.8 C)     Temp Source 11/25/15 1120 Oral     SpO2 11/25/15 1120 90 %     Weight 11/25/15 1120 266 lb (120.7 kg)       Height --      Head Circumference --      Peak Flow --      Pain Score 11/25/15 1121 0     Pain Loc --      Pain Edu? --      Excl. in GC? --     Constitutional: Alert and oriented. Well appearing and in no apparent distress. HEENT:      Head: Normocephalic and atraumatic.         Eyes: Conjunctivae are normal. Sclera is non-icteric. EOMI. PERRL      Mouth/Throat: Mucous membranes are moist.       Neck: Supple with no signs of meningismus. Cardiovascular: Regular rate and rhythm. No murmurs, gallops, or rubs. 2+ symmetrical distal pulses are present in all extremities. No JVD. Respiratory: Normal respiratory effort. Decreased air movement bilaterally. Lungs are clear to auscultation bilaterally. No wheezes, crackles, or rhonchi.  Gastrointestinal: Obese, non tender, and non distended with positive bowel sounds. No rebound or guarding. Genitourinary: No CVA tenderness. Musculoskeletal: 3+ pitting edema to the knees on bilateral lower extremities  Neurologic: Normal speech and language. Face is symmetric. Moving all extremities. No gross focal neurologic deficits are appreciated. Skin: Skin is warm, dry and intact. No rash noted. Psychiatric: Mood and affect are normal. Speech and behavior are normal.  ____________________________________________   LABS (all labs ordered are listed, but only abnormal results are displayed)  Labs Reviewed  CBC WITH DIFFERENTIAL/PLATELET - Abnormal; Notable for the following:       Result Value   RBC 5.38 (*)    MCV 74.7 (*)    MCH 23.4 (*)    MCHC 31.3 (*)    RDW 17.6 (*)    Platelets 145 (*)    Monocytes Absolute 1.0 (*)    All other components within normal limits  TROPONIN I - Abnormal; Notable for the following:    Troponin I 0.09 (*)    All other components within normal limits  COMPREHENSIVE METABOLIC PANEL - Abnormal; Notable for the following:    Glucose, Bld 153 (*)    Calcium 8.6 (*)    Albumin 3.3 (*)    AST 55 (*)    All  other components within normal limits  BRAIN NATRIURETIC PEPTIDE  TSH   ____________________________________________  EKG  ED ECG REPORT I, Nita Sicklearolina Christoffer Currier, the attending physician, personally viewed and interpreted this ECG.  Sinus rhythm with occasional PVCs, rate of 84, normal intervals, normal axis, no ST elevations or depressions. ____________________________________________  RADIOLOGY  CXR: Negative ____________________________________________   PROCEDURES  Procedure(s) performed: None Procedures Critical Care performed: yes  CRITICAL CARE Performed by: Nita Sicklearolina Demarlo Riojas  ?  Total critical care time: 30 min  Critical care time was exclusive of separately billable procedures and treating other patients.  Critical care was necessary to treat or prevent imminent or life-threatening deterioration.  Critical care was time  spent personally by me on the following activities: development of treatment plan with patient and/or surrogate as well as nursing, discussions with consultants, evaluation of patient's response to treatment, examination of patient, obtaining history from patient or surrogate, ordering and performing treatments and interventions, ordering and review of laboratory studies, ordering and review of radiographic studies, pulse oximetry and re-evaluation of patient's condition.  ____________________________________________   INITIAL IMPRESSION / ASSESSMENT AND PLAN / ED COURSE  55 y.o. female history of diabetes, hypertension, hypothyroidism, former smoking who presents for evaluation of chest pain. Patient is chest pain-free upon arrival. Her history is concerning for ischemic heart disease especially since patient is diabetic, obese, former smoker, and has shown family history of heart disease. Her initial EKG is nonischemic. Her initial troponin is elevated 0.09 with normal kidney function. We'll give her full dose of aspirin and admitted for further  evaluation. I do believe her shortness of breath is multifactorial probably from obstructive sleep apnea, maybe undiagnosed COPD as well.  Clinical Course    Pertinent labs & imaging results that were available during my care of the patient were reviewed by me and considered in my medical decision making (see chart for details).    ____________________________________________   FINAL CLINICAL IMPRESSION(S) / ED DIAGNOSES  Final diagnoses:  NSTEMI (non-ST elevated myocardial infarction) (HCC)      NEW MEDICATIONS STARTED DURING THIS VISIT:  New Prescriptions   No medications on file     Note:  This document was prepared using Dragon voice recognition software and may include unintentional dictation errors.    Nita Sickle, MD 11/25/15 1218

## 2015-11-25 NOTE — Consult Note (Signed)
Sarah Gilmore is a 55 y.o. female  [id number]  Primary Cardiologist: Adrian Blackwater Reason for Consultation: Unstable angina  HPI: Mrs. Sarah Gilmore Developed severe mid chest pain that radiated to left axilla and subsided to a pressure, lasted about 10 minutes. She has had intermittent milder such episodes for about a month and shortness of breath with minimal exertion for several months. She also has pedal edema. SHe has a history of smoking for 37 years, having quit 5-6 years ago, diabetes, hypertension, central obesity, thyroid dysfunction and unknown lipid status. Her family history is positive for multiple MI's Stents and ICD, first diagnosed with CAD in his 25's.   Review of Systems: Positive for intermittent chest pain, DOE, pedal edema. Negative for dizziness, palpitations.   Past Medical History:  Diagnosis Date  . Diabetes mellitus without complication (HCC)   . Gout   . Hypertension   . Thyroid disease   . Wrist injury     Medications Prior to Admission  Medication Sig Dispense Refill  . levothyroxine (SYNTHROID, LEVOTHROID) 150 MCG tablet Take 1 tablet (150 mcg total) by mouth daily. 30 tablet 6  . lisinopril (PRINIVIL,ZESTRIL) 5 MG tablet Take 1 tablet (5 mg total) by mouth daily. 30 tablet 6  . metFORMIN (GLUCOPHAGE) 1000 MG tablet Take 1 tablet (1,000 mg total) by mouth 2 (two) times daily with a meal. 60 tablet 12  . glucose blood (BAYER CONTOUR NEXT TEST) test strip Use as instructed 100 each 12     . [START ON 11/26/2015] aspirin EC  81 mg Oral Daily  . atorvastatin  40 mg Oral q1800  . enoxaparin (LOVENOX) injection  1 mg/kg Subcutaneous Q12H  . insulin aspart  0-15 Units Subcutaneous TID WC  . insulin aspart  0-5 Units Subcutaneous QHS  . [START ON 11/26/2015] levothyroxine  150 mcg Oral QAC breakfast  . metoprolol tartrate  12.5 mg Oral BID  . sodium chloride flush  3 mL Intravenous Q12H  . sodium chloride flush  3 mL Intravenous Q12H    Infusions:    No  Known Allergies  Social History   Social History  . Marital status: Married    Spouse name: N/A  . Number of children: N/A  . Years of education: N/A   Occupational History  . Not on file.   Social History Main Topics  . Smoking status: Former Smoker    Types: Cigarettes    Quit date: 08/17/2001  . Smokeless tobacco: Never Used  . Alcohol use Yes     Comment: SOCIAL   . Drug use: No  . Sexual activity: Not on file   Other Topics Concern  . Not on file   Social History Narrative  . No narrative on file    Family History  Problem Relation Age of Onset  . Diabetes Father   . Hypertension Father     PHYSICAL EXAM: Vitals:   11/25/15 1400 11/25/15 1445  BP: (!) 141/67 104/85  Pulse: 71 78  Resp: (!) 21 18  Temp:  97.7 F (36.5 C)    No intake or output data in the 24 hours ending 11/25/15 1502  General:  Well appearing. No respiratory difficulty HEENT: normal Neck: supple. no JVD. Carotids 2+ bilat; no bruits. No lymphadenopathy or thryomegaly appreciated. Cor: PMI nondisplaced. Regular rate & rhythm. No rubs, gallops or murmurs. Lungs: clear Abdomen: soft, nontender, nondistended. No hepatosplenomegaly. No bruits or masses. Good bowel sounds. Extremities: no cyanosis, clubbing, rash, edema Neuro: alert &  oriented x 3, cranial nerves grossly intact. moves all 4 extremities w/o difficulty. Affect pleasant.  ECG: NSR, 84 bpm, non-specific T wave changes.  Results for orders placed or performed during the hospital encounter of 11/25/15 (from the past 24 hour(s))  CBC with Differential     Status: Abnormal   Collection Time: 11/25/15 11:23 AM  Result Value Ref Range   WBC 9.0 3.6 - 11.0 K/uL   RBC 5.38 (H) 3.80 - 5.20 MIL/uL   Hemoglobin 12.6 12.0 - 16.0 g/dL   HCT 04.540.2 40.935.0 - 81.147.0 %   MCV 74.7 (L) 80.0 - 100.0 fL   MCH 23.4 (L) 26.0 - 34.0 pg   MCHC 31.3 (L) 32.0 - 36.0 g/dL   RDW 91.417.6 (H) 78.211.5 - 95.614.5 %   Platelets 145 (L) 150 - 440 K/uL   Neutrophils  Relative % 69 %   Neutro Abs 6.3 1.4 - 6.5 K/uL   Lymphocytes Relative 17 %   Lymphs Abs 1.5 1.0 - 3.6 K/uL   Monocytes Relative 11 %   Monocytes Absolute 1.0 (H) 0.2 - 0.9 K/uL   Eosinophils Relative 2 %   Eosinophils Absolute 0.2 0 - 0.7 K/uL   Basophils Relative 1 %   Basophils Absolute 0.1 0 - 0.1 K/uL  Troponin I     Status: Abnormal   Collection Time: 11/25/15 11:23 AM  Result Value Ref Range   Troponin I 0.09 (HH) <0.03 ng/mL  Comprehensive metabolic panel     Status: Abnormal   Collection Time: 11/25/15 11:23 AM  Result Value Ref Range   Sodium 138 135 - 145 mmol/L   Potassium 4.2 3.5 - 5.1 mmol/L   Chloride 102 101 - 111 mmol/L   CO2 30 22 - 32 mmol/L   Glucose, Bld 153 (H) 65 - 99 mg/dL   BUN 15 6 - 20 mg/dL   Creatinine, Ser 2.130.45 0.44 - 1.00 mg/dL   Calcium 8.6 (L) 8.9 - 10.3 mg/dL   Total Protein 6.8 6.5 - 8.1 g/dL   Albumin 3.3 (L) 3.5 - 5.0 g/dL   AST 55 (H) 15 - 41 U/L   ALT 47 14 - 54 U/L   Alkaline Phosphatase 71 38 - 126 U/L   Total Bilirubin 0.8 0.3 - 1.2 mg/dL   GFR calc non Af Amer >60 >60 mL/min   GFR calc Af Amer >60 >60 mL/min   Anion gap 6 5 - 15  Brain natriuretic peptide     Status: Abnormal   Collection Time: 11/25/15 11:23 AM  Result Value Ref Range   B Natriuretic Peptide 229.0 (H) 0.0 - 100.0 pg/mL  TSH     Status: Abnormal   Collection Time: 11/25/15 11:23 AM  Result Value Ref Range   TSH 5.318 (H) 0.350 - 4.500 uIU/mL   Dg Chest 2 View  Result Date: 11/25/2015 CLINICAL DATA:  Shortness of breath. EXAM: CHEST  2 VIEW COMPARISON:  10/18/2011 FINDINGS: Heart size and pulmonary vascularity are normal. No infiltrates or effusions. Tiny areas of linear atelectasis in both lungs. No bone abnormality. IMPRESSION: Minimal atelectasis.  Otherwise, normal exam. Electronically Signed   By: Francene BoyersJames  Maxwell M.D.   On: 11/25/2015 12:12     ASSESSMENT AND PLAN: Unstable angina with non-specific ST changes on EKG and first troponin 0.09. Pt has been  having intermittent typical chest pain and has multiple CV risk factors including hypertension, diabetes, central obesity, and positive family history of premature CAD in father. Plan is for  cardiac cath in am - on schedule for 08:30 with Dr. Welton Flakes. Procedure explained to pt and husband, risks and benefits, and questions answered.  Berton Bon, NP 11/25/2015 3:02 PM

## 2015-11-25 NOTE — ED Triage Notes (Addendum)
Pt arrives to ER via POV c/o SOB with exertion, chest tightness that occurred this AM, and falling asleep inappropriately. Pt alert and oriented X4, active, cooperative, pt in NAD. RR even and unlabored, color WNL.  Pt talking in complete and full sentences but does report that she will get SOB if she walks around.

## 2015-11-25 NOTE — ED Notes (Signed)
Pt 81% on room air; pt placed on 3L nasal cannula to achieve sats >90%.

## 2015-11-25 NOTE — H&P (Signed)
[facility] [place] [facility]   PATIENT NAME: Sarah Gilmore Nath    MR#:  [id number]  DATE OF BIRTH:  Jun 03, 1960  DATE OF ADMISSION:  11/25/2015  PRIMARY CARE PHYSICIAN: Fidel LevyJames Hawkins Jr, MD   REQUESTING/REFERRING PHYSICIAN: Dr. Don PerkingVeronese  CHIEF COMPLAINT:   Chief Complaint  Patient presents with  . Shortness of Breath    HISTORY OF PRESENT ILLNESS:  Sarah Gilmore Mayweather  is a 55 y.o. female with a known history of Diabetes, hypertension, obesity, past tobacco use, possible sleep apnea presents to the hospital complaining of acute onset of chest heaviness lasting 10 minutes. She has noticed mild symptoms like this in the past but today this was unbearable and had to come to the emergency room. This happened at work while patient was at rest. Associated with sweating and shortness of breath. She has noticed chronically worsening shortness of breath over the last 6 months along with weight gain. No recent change in medications. She has had lower extremity edema with orthopnea. Has daytime sleepiness and night snoring. Here patient had saturations 80s on room air and had to be placed on 2 L oxygen. EKG looks normal but troponin is 0.09 and is being admitted due to NSTEMI.  PAST MEDICAL HISTORY:   Past Medical History:  Diagnosis Date  . Diabetes mellitus without complication (HCC)   . Gout   . Hypertension   . Thyroid disease   . Wrist injury     PAST SURGICAL HISTORY:   Past Surgical History:  Procedure Laterality Date  . ABDOMINAL HYSTERECTOMY    . BREAST SURGERY Bilateral   . TONSILLECTOMY      SOCIAL HISTORY:   Social History  Substance Use Topics  . Smoking status: Former Smoker    Types: Cigarettes    Quit date: 08/17/2001  . Smokeless tobacco: Never Used  . Alcohol use Yes     Comment: SOCIAL     FAMILY HISTORY:   Family History  Problem Relation Age of Onset  . Diabetes Father   . Hypertension Father     DRUG ALLERGIES:  No Known  Allergies  REVIEW OF SYSTEMS:   Review of Systems  Constitutional: Positive for malaise/fatigue. Negative for chills, fever and weight loss.  HENT: Negative for hearing loss and nosebleeds.   Eyes: Negative for blurred vision, double vision and pain.  Respiratory: Positive for shortness of breath. Negative for cough, hemoptysis, sputum production and wheezing.   Cardiovascular: Positive for chest pain, orthopnea and leg swelling. Negative for palpitations.  Gastrointestinal: Negative for abdominal pain, constipation, diarrhea, nausea and vomiting.  Genitourinary: Negative for dysuria and hematuria.  Musculoskeletal: Negative for back pain, falls and myalgias.  Skin: Negative for rash.  Neurological: Positive for weakness. Negative for dizziness, tremors, sensory change, speech change, focal weakness, seizures and headaches.  Endo/Heme/Allergies: Does not bruise/bleed easily.  Psychiatric/Behavioral: Negative for depression and memory loss. The patient is nervous/anxious.     MEDICATIONS AT HOME:   Prior to Admission medications   Medication Sig Start Date End Date Taking? Authorizing Provider  levothyroxine (SYNTHROID, LEVOTHROID) 150 MCG tablet Take 1 tablet (150 mcg total) by mouth daily. 10/13/15  Yes Janeann ForehandJames H Hawkins Jr., MD  lisinopril (PRINIVIL,ZESTRIL) 5 MG tablet Take 1 tablet (5 mg total) by mouth daily. 06/18/15  Yes Janeann ForehandJames H Hawkins Jr., MD  metFORMIN (GLUCOPHAGE) 1000 MG tablet Take 1 tablet (1,000 mg total) by mouth 2 (two) times daily with a meal. 06/18/15  Yes Allayne GitelmanJames H  Jonna Clark., MD  glucose blood (BAYER CONTOUR NEXT TEST) test strip Use as instructed 05/07/15   Janeann Forehand., MD     VITAL SIGNS:  Blood pressure 132/63, pulse 79, temperature 98.2 F (36.8 C), temperature source Oral, resp. rate 18, weight 120.7 kg (266 lb), SpO2 95 %.  PHYSICAL EXAMINATION:  Physical Exam  GENERAL:  55 y.o.-year-old patient lying in the bed with Mild respiratory distress with  conversational dyspnea. Morbidly obese EYES: Pupils equal, round, reactive to light and accommodation. No scleral icterus. Extraocular muscles intact.  HEENT: Head atraumatic, normocephalic. Oropharynx and nasopharynx clear. No oropharyngeal erythema, moist oral mucosa  NECK:  Supple, no jugular venous distention. No thyroid enlargement, no tenderness.  LUNGS: Increased work of breathing. Clear to auscultation on both sides. CARDIOVASCULAR: S1, S2 normal. No murmurs, rubs, or gallops.  ABDOMEN: Soft, nontender, nondistended. Bowel sounds present. No organomegaly or mass.  EXTREMITIES: No  cyanosis, or clubbing. + 2 pedal & radial pulses b/l.  2+ lower extremity edema NEUROLOGIC: Cranial nerves II through XII are intact. No focal Motor or sensory deficits appreciated b/l PSYCHIATRIC: The patient is alert and oriented x 3. Good affect.  SKIN: No obvious rash, lesion, or ulcer.   LABORATORY PANEL:   CBC  Recent Labs Lab 11/25/15 1123  WBC 9.0  HGB 12.6  HCT 40.2  PLT 145*   ------------------------------------------------------------------------------------------------------------------  Chemistries   Recent Labs Lab 11/25/15 1123  NA 138  K 4.2  CL 102  CO2 30  GLUCOSE 153*  BUN 15  CREATININE 0.45  CALCIUM 8.6*  AST 55*  ALT 47  ALKPHOS 71  BILITOT 0.8   ------------------------------------------------------------------------------------------------------------------  Cardiac Enzymes  Recent Labs Lab 11/25/15 1123  TROPONINI 0.09*   ------------------------------------------------------------------------------------------------------------------  RADIOLOGY:  Dg Chest 2 View  Result Date: 11/25/2015 CLINICAL DATA:  Shortness of breath. EXAM: CHEST  2 VIEW COMPARISON:  10/18/2011 FINDINGS: Heart size and pulmonary vascularity are normal. No infiltrates or effusions. Tiny areas of linear atelectasis in both lungs. No bone abnormality. IMPRESSION: Minimal  atelectasis.  Otherwise, normal exam. Electronically Signed   By: Francene Boyers M.D.   On: 11/25/2015 12:12     IMPRESSION AND PLAN:   * NSTEMI ASA, Statin, Metoprolol. Nitro PRN Lovenox Therapeutic dose Repeat troponin. Consult Dr. Welton Flakes with cardiology who family has requested. Check echocardiogram. Hold lisinopril and metoprolol in case patient needs cardiac catheterization.  * Acute hypoxic respiratory failure with lower extremity edema Chest x-ray shows no frank pulmonary edema. We'll check a BNP. One dose of IV Lasix. Wean oxygen as tolerated. Nebs when necessary. Monitor input and output. Check echocardiogram.  * Diabetes mellitus type 2 Hold metformin. Sliding scale insulin.  * Hypertension Hold lisinopril. Started low-dose metoprolol.  * Morbid obesity with possible sleep apnea Will need outpatient sleep study with pulmonary.  * DVT prophylaxis. Patient is on therapeutic Lovenox.  All the records are reviewed and case discussed with ED provider. Management plans discussed with the patient, family and they are in agreement.  CODE STATUS: FULL CODE  TOTAL TIME TAKING CARE OF THIS PATIENT: 40 minutes.   Milagros Loll R M.D on 11/25/2015 at 1:00 PM  Between 7am to 6pm - Pager - [phone]  After 6pm go to [email or web address] - password [email or web address] [facility]  [place] [facility]  Office  [phone]  CC: Primary care physician; Fidel Levy, MD  Note: This dictation was prepared with Dragon dictation along with smaller phrase technology. Any transcriptional errors that result  from this process are unintentional.

## 2015-11-25 NOTE — ED Notes (Signed)
Charge RN notified of delay in report.

## 2015-11-25 NOTE — ED Notes (Signed)
Attempted to call report, RN in isolation room at this time, placed on hold at this time greater than 5 minutes.  Called back, asked to speak with charge RN to give report, placed on hold again greater than 5 minutes.

## 2015-11-26 ENCOUNTER — Encounter
Admission: EM | Disposition: A | Discharge status: Home / Self Care | Payer: Self-pay | Source: Home / Self Care | Attending: Internal Medicine

## 2015-11-26 DIAGNOSIS — I2510 Atherosclerotic heart disease of native coronary artery without angina pectoris: Secondary | ICD-10-CM | POA: Diagnosis present

## 2015-11-26 HISTORY — PX: CARDIAC CATHETERIZATION: SHX172

## 2015-11-26 LAB — ECHOCARDIOGRAM COMPLETE
Height: 65 in
Weight: 4457.6 [oz_av]

## 2015-11-26 LAB — BASIC METABOLIC PANEL
Anion gap: 7 (ref 5–15)
BUN: 15 mg/dL (ref 6–20)
CHLORIDE: 99 mmol/L — AB (ref 101–111)
CO2: 36 mmol/L — ABNORMAL HIGH (ref 22–32)
CREATININE: 0.41 mg/dL — AB (ref 0.44–1.00)
Calcium: 8.7 mg/dL — ABNORMAL LOW (ref 8.9–10.3)
Glucose, Bld: 120 mg/dL — ABNORMAL HIGH (ref 65–99)
POTASSIUM: 4.3 mmol/L (ref 3.5–5.1)
SODIUM: 142 mmol/L (ref 135–145)

## 2015-11-26 LAB — GLUCOSE, CAPILLARY
GLUCOSE-CAPILLARY: 89 mg/dL (ref 65–99)
GLUCOSE-CAPILLARY: 117 mg/dL — AB (ref 65–99)
Glucose-Capillary: 106 mg/dL — ABNORMAL HIGH (ref 65–99)

## 2015-11-26 LAB — PROTIME-INR
INR: 1.04
PROTHROMBIN TIME: 13.6 s (ref 11.4–15.2)

## 2015-11-26 LAB — TROPONIN I: TROPONIN I: 0.1 ng/mL — AB (ref <0.03)

## 2015-11-26 SURGERY — LEFT HEART CATH AND CORONARY ANGIOGRAPHY
Anesthesia: Moderate Sedation

## 2015-11-26 MED ORDER — ONDANSETRON HCL 4 MG/2ML IJ SOLN: 4.0000 mg | Freq: Four times a day (QID) | INTRAMUSCULAR | Status: DC | PRN

## 2015-11-26 MED ORDER — SODIUM CHLORIDE 0.9 % WEIGHT BASED INFUSION: 1.0000 mL/kg/h | INTRAVENOUS | Status: DC

## 2015-11-26 MED ORDER — FUROSEMIDE 10 MG/ML IJ SOLN
20.0000 mg | Freq: Once | INTRAMUSCULAR | Status: AC
  Administered 2015-11-26: 20 mg via INTRAVENOUS
  Filled 2015-11-26: qty 2

## 2015-11-26 MED ORDER — SODIUM CHLORIDE 0.9% FLUSH: 3.0000 mL | Freq: Two times a day (BID) | INTRAVENOUS | Status: DC

## 2015-11-26 MED ORDER — MIDAZOLAM HCL 2 MG/2ML IJ SOLN
INTRAMUSCULAR | Status: DC | PRN
  Administered 2015-11-26: 1 mg via INTRAVENOUS

## 2015-11-26 MED ORDER — MIDAZOLAM HCL 2 MG/2ML IJ SOLN
INTRAMUSCULAR | Status: AC
  Filled 2015-11-26: qty 2

## 2015-11-26 MED ORDER — HEPARIN (PORCINE) IN NACL 2-0.9 UNIT/ML-% IJ SOLN
INTRAMUSCULAR | Status: AC
  Filled 2015-11-26: qty 500

## 2015-11-26 MED ORDER — TICAGRELOR 90 MG PO TABS
180.0000 mg | ORAL_TABLET | ORAL | Status: AC
  Administered 2015-11-26: 180 mg via ORAL
  Filled 2015-11-26 (×2): qty 2

## 2015-11-26 MED ORDER — SODIUM CHLORIDE 0.9% FLUSH: 3.0000 mL | INTRAVENOUS | Status: DC | PRN

## 2015-11-26 MED ORDER — FENTANYL CITRATE (PF) 100 MCG/2ML IJ SOLN
INTRAMUSCULAR | Status: AC
  Filled 2015-11-26: qty 2

## 2015-11-26 MED ORDER — FUROSEMIDE 20 MG PO TABS: 20.0000 mg | ORAL_TABLET | Freq: Every day | ORAL | Status: DC

## 2015-11-26 MED ORDER — SODIUM CHLORIDE 0.9 % WEIGHT BASED INFUSION
3.0000 mL/kg/h | INTRAVENOUS | Status: DC
  Administered 2015-11-26: 3 mL/kg/h via INTRAVENOUS

## 2015-11-26 MED ORDER — FUROSEMIDE 20 MG PO TABS: 20.0000 mg | ORAL_TABLET | Freq: Every day | ORAL | 1 refills | Status: AC

## 2015-11-26 MED ORDER — SODIUM CHLORIDE 0.9 % WEIGHT BASED INFUSION: 1.0000 mL/kg/h | INTRAVENOUS | Status: AC

## 2015-11-26 MED ORDER — SODIUM CHLORIDE 0.9 % IV SOLN
INTRAVENOUS | Status: AC
  Administered 2015-11-26: 10:00:00 via INTRAVENOUS

## 2015-11-26 MED ORDER — ACETAMINOPHEN 325 MG PO TABS: 650.0000 mg | ORAL_TABLET | ORAL | Status: DC | PRN

## 2015-11-26 MED ORDER — ASPIRIN 81 MG PO TBEC: 81.0000 mg | DELAYED_RELEASE_TABLET | Freq: Every day | ORAL | 0 refills | Status: AC

## 2015-11-26 MED ORDER — SODIUM CHLORIDE 0.9 % IV SOLN: 250.0000 mL | INTRAVENOUS | Status: DC | PRN

## 2015-11-26 MED ORDER — OMEPRAZOLE 20 MG PO CPDR: 20.0000 mg | DELAYED_RELEASE_CAPSULE | Freq: Every day | ORAL | 1 refills | Status: AC

## 2015-11-26 MED ORDER — NITROGLYCERIN 0.4 MG SL SUBL: 0.4000 mg | SUBLINGUAL_TABLET | SUBLINGUAL | 12 refills | Status: AC | PRN

## 2015-11-26 MED ORDER — HYDROCHLOROTHIAZIDE 25 MG PO TABS: 25.0000 mg | ORAL_TABLET | Freq: Every day | ORAL | Status: DC

## 2015-11-26 MED ORDER — ASPIRIN 81 MG PO CHEW: 81.0000 mg | CHEWABLE_TABLET | ORAL | Status: DC

## 2015-11-26 MED ORDER — METFORMIN HCL 1000 MG PO TABS: 1000.0000 mg | ORAL_TABLET | Freq: Two times a day (BID) | ORAL | 12 refills | Status: AC

## 2015-11-26 MED ORDER — METOPROLOL SUCCINATE ER 25 MG PO TB24: 25.0000 mg | ORAL_TABLET | Freq: Every day | ORAL | Status: DC

## 2015-11-26 MED ORDER — METOPROLOL TARTRATE 25 MG PO TABS: 12.5000 mg | ORAL_TABLET | Freq: Two times a day (BID) | ORAL | 0 refills | Status: AC

## 2015-11-26 MED ORDER — IOPAMIDOL (ISOVUE-300) INJECTION 61%
INTRAVENOUS | Status: DC | PRN
  Administered 2015-11-26: 240 mL via INTRA_ARTERIAL

## 2015-11-26 SURGICAL SUPPLY — 14 items
CATH AMP RT 5F (CATHETERS) ×3 IMPLANT
CATH INFINITI 5 FR 3DRC (CATHETERS) ×3 IMPLANT
CATH INFINITI 5 FR AL2 (CATHETERS) ×3 IMPLANT
CATH INFINITI 5 FR JR3.5 (CATHETERS) ×3 IMPLANT
CATH INFINITI 5 FR MPA2 (CATHETERS) ×3 IMPLANT
CATH INFINITI 5FR AL1 (CATHETERS) ×3 IMPLANT
CATH INFINITI 5FR ANG PIGTAIL (CATHETERS) ×3 IMPLANT
CATH INFINITI 5FR JL4 (CATHETERS) ×3 IMPLANT
CATH INFINITI JR4 5F (CATHETERS) ×3 IMPLANT
KIT MANI 3VAL PERCEP (MISCELLANEOUS) ×3 IMPLANT
NEEDLE PERC 18GX7CM (NEEDLE) ×3 IMPLANT
PACK CARDIAC CATH (CUSTOM PROCEDURE TRAY) ×3 IMPLANT
SHEATH PINNACLE 5F 10CM (SHEATH) ×3 IMPLANT
WIRE EMERALD 3MM-J .035X150CM (WIRE) ×3 IMPLANT

## 2015-11-26 NOTE — Progress Notes (Signed)
Patient has normal coronaries with normal left radical systolic function. Patient can go home with follow-up in the office next Monday at 2:30 PM. May send the patient home on Protonix 40 by mouth twice a day

## 2015-11-26 NOTE — Discharge Instructions (Signed)
Wear your oxygen per instructions Low sodium diet F/u heart failure clinic

## 2015-11-26 NOTE — Progress Notes (Signed)
Brillenta and 81 mg ASA given a Cath prep.  BP 118/58, Pulse 71.  IV NS started at 380 ml/ hr.  To receive 380 ml then switch to 10 ml/ hr.

## 2015-11-26 NOTE — Care Management Note (Signed)
Case Management Note  Patient Details  Name: Sarah Gilmore MRN: [id number] Date of Birth: [birth year]  Subjective/Objective:  Met with patient at bedside. She is alert, active, drives and works.  She will be returning to work on Monday or Tuesday. Qualifies for home O2.. New onset CHF. Referred to CHF clinic portal.  Ordered from [facility].                 Action/Plan: New onset CHF and home O2.   Expected Discharge Date:    11/26/2015              Expected Discharge Plan:  Home/Self Care  In-House Referral:     Discharge planning Services  CM Consult, Homebound not met per provider  Post Acute Care Choice:  Durable Medical Equipment Choice offered to:  Patient  DME Arranged:  Oxygen DME Agency:  [place]:    [facility] Agency:     Status of Service:     If discussed at [place] of Stay Meetings, dates discussed:    Additional Comments:  Jolly Mango, RN 11/26/2015, 3:41 PM

## 2015-11-26 NOTE — Progress Notes (Signed)
Returned from cardiac cath.  Right groin site is clean and dry.  No swelling around the site. Denies pain.

## 2015-11-26 NOTE — Care Management (Signed)
SATURATION QUALIFICATIONS: (This note is used to comply with regulatory documentation for home oxygen)  Patient Saturations on Room Air at Rest = 84 %  Patient Saturations on Room Air while Ambulating =   Patient Saturations on  Liters of oxygen while Ambulating =  Please briefly explain why patient needs home oxygen: New onset CHF

## 2015-11-26 NOTE — Progress Notes (Signed)
SUBJECTIVE: No further chest pain   Vitals:   11/26/15 0403 11/26/15 0500 11/26/15 0745 11/26/15 0810  BP: (!) 126/58  (!) 118/58 120/72  Pulse: 86  71 74  Resp: 16  18 20   Temp: 98.3 F (36.8 C)  97.6 F (36.4 C) 97.5 F (36.4 C)  TempSrc: Oral  Oral Oral  SpO2: 91%  92% 93%  Weight:  272 lb (123.4 kg)    Height:        Intake/Output Summary (Last 24 hours) at 11/26/15 0827 Last data filed at 11/25/15 2130  Gross per 24 hour  Intake              100 ml  Output              600 ml  Net             -500 ml    LABS: Basic Metabolic Panel:  Recent Labs  16/01/9607/09/17 1123 11/26/15 0427  NA 138 142  K 4.2 4.3  CL 102 99*  CO2 30 36*  GLUCOSE 153* 120*  BUN 15 15  CREATININE 0.45 0.41*  CALCIUM 8.6* 8.7*   Liver Function Tests:  Recent Labs  11/25/15 1123  AST 55*  ALT 47  ALKPHOS 71  BILITOT 0.8  PROT 6.8  ALBUMIN 3.3*   No results for input(s): LIPASE, AMYLASE in the last 72 hours. CBC:  Recent Labs  11/25/15 1123  WBC 9.0  NEUTROABS 6.3  HGB 12.6  HCT 40.2  MCV 74.7*  PLT 145*   Cardiac Enzymes:  Recent Labs  11/25/15 1123 11/25/15 1624 11/25/15 2207  TROPONINI 0.09* 0.10* 0.10*   BNP: Invalid input(s): POCBNP D-Dimer: No results for input(s): DDIMER in the last 72 hours. Hemoglobin A1C: No results for input(s): HGBA1C in the last 72 hours. Fasting Lipid Panel: No results for input(s): CHOL, HDL, LDLCALC, TRIG, CHOLHDL, LDLDIRECT in the last 72 hours. Thyroid Function Tests:  Recent Labs  11/25/15 1123  TSH 5.318*   Anemia Panel: No results for input(s): VITAMINB12, FOLATE, FERRITIN, TIBC, IRON, RETICCTPCT in the last 72 hours.   PHYSICAL EXAM General: Well developed, well nourished, in no acute distress HEENT:  Normocephalic and atramatic Neck:  No JVD.  Lungs: Clear bilaterally to auscultation and percussion. Heart: HRRR . Normal S1 and S2 without gallops or murmurs.  Abdomen: Bowel sounds are positive, abdomen soft and  non-tender  Msk:  Back normal, normal gait. Normal strength and tone for age. Extremities: No clubbing, cyanosis or edema.   Neuro: Alert and oriented X 3. Psych:  Good affect, responds appropriately  TELEMETRY:Sinus rhythm  ASSESSMENT AND PLAN: Unstable angina/non-STEMI. Patient is no longer having any chest pain and will do cardiac catheterization today. Patient was explained procedure and has signed the consent.  Active Problems:   NSTEMI (non-ST elevated myocardial infarction) (HCC)    Sarah Gilmore,Sarah Ovens A, MD, [facility] 11/26/2015 8:27 AM

## 2015-11-26 NOTE — Discharge Summary (Addendum)
[facility] - [place] at [facility]   PATIENT NAME: Sarah Gilmore    MR#:  [id number]  DATE OF BIRTH:  08-Dec-1960  DATE OF ADMISSION:  11/25/2015 ADMITTING PHYSICIAN: Milagros Loll, MD  DATE OF DISCHARGE: 11/26/15  PRIMARY CARE PHYSICIAN: Fidel Levy, MD    ADMISSION DIAGNOSIS:  NSTEMI (non-ST elevated myocardial infarction) (HCC) [I21.4]  DISCHARGE DIAGNOSIS:  Acute CHF, mild diastolic-new onset Morbid obesity Possible sleep apnea (defere sleep study with PCP) HTN  SECONDARY DIAGNOSIS:   Past Medical History:  Diagnosis Date  . Diabetes mellitus without complication (HCC)   . Gout   . Hypertension   . Thyroid disease   . Wrist injury     HOSPITAL COURSE:  Sarah Gilmore  is a 55 y.o. female with a known history of Diabetes, hypertension, obesity, past tobacco use, possible sleep apnea presents to the hospital complaining of acute onset of chest heaviness lasting 10 minutes. She has noticed mild symptoms like this in the past but today this was unbearable and had to come to the emergency room. This happened at work while patient was at rest. Associated with sweating and shortness of breath. She has noticed chronically worsening shortness of breath over the last 6 months along with weight gain.  * Acute hypoxic respiratory failure with lower extremity edema due to new onset CHF acute diastolic with h/o HTN and possible untreated OSA Chest x-ray shows no frank pulmonary edema. But elevated BNP and leg edema and sats down to 83% on RA Place on po lasix 20 mg for 10 days and f/u out pt with PCP Nebs when necessary. Monitor input and output.  - echocardiogram.showed EF 75%  *elevated troponin due to demand ischemia -Cardica cath nromal coronaries -cont ASA, Statin, Metoprolol. Nitro PRN -seen by Dr Welton Flakes  * Diabetes mellitus type 2 Hold metformin. Sliding scale insulin. -resume metformin after 48 hours  * Hypertension -\now on low-dose  metoprolol and lisinopril  * Morbid obesity with possible sleep apnea Will need outpatient sleep study with pulmonary.  * DVT prophylaxis. Patient is on therapeutic Lovenox.  Pt requesting to go home. She will get another dose of IV lasix and oxygen will be set up rior to d/c Pt is advised to follow at CHF clinic. CM to make arrangements.  CONSULTS OBTAINED:  Treatment Team:  Laurier Nancy, MD  DRUG ALLERGIES:  No Known Allergies  DISCHARGE MEDICATIONS:   Current Discharge Medication List    START taking these medications   Details  aspirin EC 81 MG EC tablet Take 1 tablet (81 mg total) by mouth daily. Qty: 30 tablet, Refills: 0    furosemide (LASIX) 20 MG tablet Take 1 tablet (20 mg total) by mouth daily. Qty: 10 tablet, Refills: 1    metoprolol tartrate (LOPRESSOR) 25 MG tablet Take 0.5 tablets (12.5 mg total) by mouth 2 (two) times daily. Qty: 60 tablet, Refills: 0    nitroGLYCERIN (NITROSTAT) 0.4 MG SL tablet Place 1 tablet (0.4 mg total) under the tongue every 5 (five) minutes as needed for chest pain. Qty: 15 tablet, Refills: 12    omeprazole (PRILOSEC) 20 MG capsule Take 1 capsule (20 mg total) by mouth daily. Qty: 60 capsule, Refills: 1      CONTINUE these medications which have CHANGED   Details  metFORMIN (GLUCOPHAGE) 1000 MG tablet Take 1 tablet (1,000 mg total) by mouth 2 (two) times daily with a meal. After 48 hours Qty: 60 tablet, Refills: 12  Associated Diagnoses: Type 2 diabetes mellitus without complication, without long-term current use of insulin (HCC)      CONTINUE these medications which have NOT CHANGED   Details  levothyroxine (SYNTHROID, LEVOTHROID) 150 MCG tablet Take 1 tablet (150 mcg total) by mouth daily. Qty: 30 tablet, Refills: 6    lisinopril (PRINIVIL,ZESTRIL) 5 MG tablet Take 1 tablet (5 mg total) by mouth daily. Qty: 30 tablet, Refills: 6    glucose blood (BAYER CONTOUR NEXT TEST) test strip Use as instructed Qty: 100 each,  Refills: 12   Associated Diagnoses: Type 2 diabetes mellitus without complication, without long-term current use of insulin (HCC)        If you experience worsening of your admission symptoms, develop shortness of breath, life threatening emergency, suicidal or homicidal thoughts you must seek medical attention immediately by calling 911 or calling your MD immediately  if symptoms less severe.  You Must read complete instructions/literature along with all the possible adverse reactions/side effects for all the Medicines you take and that have been prescribed to you. Take any new Medicines after you have completely understood and accept all the possible adverse reactions/side effects.   Please note  You were cared for by a hospitalist during your hospital stay. If you have any questions about your discharge medications or the care you received while you were in the hospital after you are discharged, you can call the unit and asked to speak with the hospitalist on call if the hospitalist that took care of you is not available. Once you are discharged, your primary care physician will handle any further medical issues. Please note that NO REFILLS for any discharge medications will be authorized once you are discharged, as it is imperative that you return to your primary care physician (or establish a relationship with a primary care physician if you do not have one) for your aftercare needs so that they can reassess your need for medications and monitor your lab values. Today   SUBJECTIVE   Sob with laying flat and leg edema  VITAL SIGNS:  Blood pressure (!) 145/65, pulse 72, temperature 97.6 F (36.4 C), temperature source Oral, resp. rate 20, height  (1.651 m), weight 123.4 kg (272 lb), SpO2 (!) 84 %.  I/O:    Intake/Output Summary (Last 24 hours) at 11/26/15 1547 Last data filed at 11/26/15 1435  Gross per 24 hour  Intake              860 ml  Output             1500 ml  Net              -640 ml    PHYSICAL EXAMINATION:  GENERAL:  55 y.o.-year-old patient lying in the bed with no acute distress.obese EYES: Pupils equal, round, reactive to light and accommodation. No scleral icterus. Extraocular muscles intact.  HEENT: Head atraumatic, normocephalic. Oropharynx and nasopharynx clear.  NECK:  Supple, no jugular venous distention. No thyroid enlargement, no tenderness.  LUNGS: Normal breath sounds bilaterally, no wheezing, rales,rhonchi or crepitation. No use of accessory muscles of respiration.  CARDIOVASCULAR: S1, S2 normal. No murmurs, rubs, or gallops.  ABDOMEN: Soft, non-tender, non-distended. Bowel sounds present. No organomegaly or mass.  EXTREMITIES: 1+ pedal edema,no  cyanosis, or clubbing.  NEUROLOGIC: Cranial nerves II through XII are intact. Muscle strength 5/5 in all extremities. Sensation intact. Gait not checked.  PSYCHIATRIC: The patient is alert and oriented x 3.  SKIN: No obvious  rash, lesion, or ulcer.   DATA REVIEW:   CBC   Recent Labs Lab 11/25/15 1123  WBC 9.0  HGB 12.6  HCT 40.2  PLT 145*    Chemistries   Recent Labs Lab 11/25/15 1123 11/26/15 0427  NA 138 142  K 4.2 4.3  CL 102 99*  CO2 30 36*  GLUCOSE 153* 120*  BUN 15 15  CREATININE 0.45 0.41*  CALCIUM 8.6* 8.7*  AST 55*  --   ALT 47  --   ALKPHOS 71  --   BILITOT 0.8  --     Microbiology Results   Recent Results (from the past 240 hour(s))  MRSA PCR Screening     Status: None   Collection Time: 11/25/15  2:59 PM  Result Value Ref Range Status   MRSA by PCR NEGATIVE NEGATIVE Final    Comment:        The GeneXpert MRSA Assay (FDA approved for NASAL specimens only), is one component of a comprehensive MRSA colonization surveillance program. It is not intended to diagnose MRSA infection nor to guide or monitor treatment for MRSA infections.     RADIOLOGY:  Dg Chest 2 View  Result Date: 11/25/2015 CLINICAL DATA:  Shortness of breath. EXAM: CHEST  2 VIEW  COMPARISON:  10/18/2011 FINDINGS: Heart size and pulmonary vascularity are normal. No infiltrates or effusions. Tiny areas of linear atelectasis in both lungs. No bone abnormality. IMPRESSION: Minimal atelectasis.  Otherwise, normal exam. Electronically Signed   By: Francene BoyersJames  Maxwell M.D.   On: 11/25/2015 12:12     Management plans discussed with the patient, family and they are in agreement.  CODE STATUS:     Code Status Orders        Start     Ordered   11/26/15 1006  Full code  Continuous     11/26/15 1005    Code Status History    Date Active Date Inactive Code Status Order ID Comments User Context   11/26/2015 10:05 AM 11/26/2015 10:05 AM Full Code [id number]  Laurier NancyShaukat A Khan, MD Inpatient   11/25/2015 12:55 PM 11/25/2015  8:46 PM Full Code [id number]  Milagros LollSrikar Sudini, MD ED      TOTAL TIME TAKING CARE OF THIS PATIENT: 40 minutes.    Dayon Witt M.D on 11/26/2015 at 3:47 PM  Between 7am to 6pm - Pager - [phone] After 6pm go to [email or web address] - password [email or web address] [facility]  [place] [place] [facility]  Office  [phone]  CC: Primary care physician; Fidel LevyJames Hawkins Jr, MD

## 2015-11-26 NOTE — Progress Notes (Signed)
Discharged to home with family.  Initiated on home Oxygen via nasal cannula at 2 L.  IV's DC without problems.  Telemetry discontinued.  Reviewed medications and return appointments with patient and husband.  Patient education re CAD, CHF, low salt diet, and  post cardiac cath care provided.

## 2015-11-27 ENCOUNTER — Encounter: Payer: Self-pay | Admitting: Cardiovascular Disease

## 2015-12-01 ENCOUNTER — Telehealth: Payer: Self-pay | Admitting: Family

## 2015-12-01 NOTE — Telephone Encounter (Signed)
Received HF Clinic referral after patient had been discharged. Called patient and left a message on her voicemail explaining the HF Clinic services and asking her to call back to schedule an appointment.

## 2015-12-03 ENCOUNTER — Ambulatory Visit: Payer: BLUE CROSS/BLUE SHIELD | Admitting: Family Medicine

## 2015-12-23 ENCOUNTER — Other Ambulatory Visit: Payer: Self-pay | Admitting: Internal Medicine

## 2015-12-23 DIAGNOSIS — I2781 Cor pulmonale (chronic): Secondary | ICD-10-CM

## 2015-12-24 ENCOUNTER — Ambulatory Visit
Admission: RE | Admit: 2015-12-24 | Discharge: 2015-12-24 | Disposition: A | Discharge status: Home / Self Care | Payer: BLUE CROSS/BLUE SHIELD | Source: Ambulatory Visit | Attending: Internal Medicine | Admitting: Internal Medicine

## 2015-12-24 DIAGNOSIS — I2781 Cor pulmonale (chronic): Secondary | ICD-10-CM | POA: Insufficient documentation

## 2015-12-24 DIAGNOSIS — R161 Splenomegaly, not elsewhere classified: Secondary | ICD-10-CM | POA: Diagnosis not present

## 2015-12-24 DIAGNOSIS — I700 Atherosclerosis of aorta: Secondary | ICD-10-CM | POA: Insufficient documentation

## 2015-12-24 DIAGNOSIS — K7460 Unspecified cirrhosis of liver: Secondary | ICD-10-CM | POA: Insufficient documentation

## 2015-12-24 MED ORDER — IOPAMIDOL (ISOVUE-370) INJECTION 76%
100.0000 mL | Freq: Once | INTRAVENOUS | Status: AC | PRN
  Administered 2015-12-24: 100 mL via INTRAVENOUS

## 2016-01-14 ENCOUNTER — Ambulatory Visit: Payer: BLUE CROSS/BLUE SHIELD | Admitting: Family Medicine

## 2016-01-19 ENCOUNTER — Other Ambulatory Visit: Payer: Self-pay | Admitting: Gastroenterology

## 2016-01-19 DIAGNOSIS — K7460 Unspecified cirrhosis of liver: Secondary | ICD-10-CM

## 2016-01-26 ENCOUNTER — Ambulatory Visit: Admission: RE | Admit: 2016-01-26 | Payer: BLUE CROSS/BLUE SHIELD | Source: Ambulatory Visit

## 2016-01-26 ENCOUNTER — Ambulatory Visit
Admission: RE | Admit: 2016-01-26 | Discharge: 2016-01-26 | Disposition: A | Discharge status: Home / Self Care | Payer: BLUE CROSS/BLUE SHIELD | Source: Ambulatory Visit | Attending: Gastroenterology | Admitting: Gastroenterology

## 2016-01-26 DIAGNOSIS — K7460 Unspecified cirrhosis of liver: Secondary | ICD-10-CM

## 2016-02-01 ENCOUNTER — Ambulatory Visit
Admission: RE | Admit: 2016-02-01 | Discharge: 2016-02-01 | Disposition: A | Discharge status: Home / Self Care | Payer: BLUE CROSS/BLUE SHIELD | Source: Ambulatory Visit | Attending: Gastroenterology | Admitting: Gastroenterology

## 2016-02-01 DIAGNOSIS — R161 Splenomegaly, not elsewhere classified: Secondary | ICD-10-CM | POA: Insufficient documentation

## 2016-02-01 DIAGNOSIS — K7460 Unspecified cirrhosis of liver: Secondary | ICD-10-CM | POA: Insufficient documentation

## 2016-02-03 ENCOUNTER — Other Ambulatory Visit
Admission: RE | Admit: 2016-02-03 | Discharge: 2016-02-03 | Disposition: A | Discharge status: Home / Self Care | Payer: BLUE CROSS/BLUE SHIELD | Source: Ambulatory Visit | Attending: Gastroenterology | Admitting: Gastroenterology

## 2016-02-03 DIAGNOSIS — R197 Diarrhea, unspecified: Secondary | ICD-10-CM | POA: Diagnosis not present

## 2016-02-03 LAB — GASTROINTESTINAL PANEL BY PCR, STOOL (REPLACES STOOL CULTURE)

## 2016-02-03 LAB — C DIFFICILE QUICK SCREEN W PCR REFLEX
C Diff antigen: POSITIVE — AB
C Diff toxin: NEGATIVE

## 2016-02-03 LAB — CLOSTRIDIUM DIFFICILE BY PCR: CDIFFPCR: NEGATIVE

## 2016-05-12 ENCOUNTER — Ambulatory Visit
Admission: RE | Admit: 2016-05-12 | Payer: BLUE CROSS/BLUE SHIELD | Source: Ambulatory Visit | Admitting: Gastroenterology

## 2016-05-12 SURGERY — EGD (ESOPHAGOGASTRODUODENOSCOPY)
Anesthesia: General

## 2017-02-23 IMAGING — US US ABDOMEN LIMITED
1 series · 14 of 25 positions shown · non-contrast
Comparison: None.

CLINICAL DATA: Elevated liver function tests

EXAM:
US ABDOMEN LIMITED - RIGHT UPPER QUADRANT

[Series 1: us abdomen limited · 0.26mm/px · 14 of 46 slices shown]
[im 1/46]
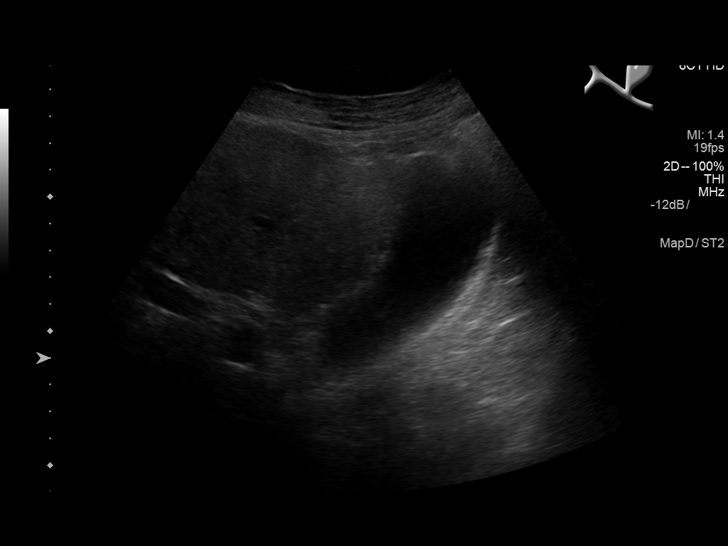
[im 4/46]
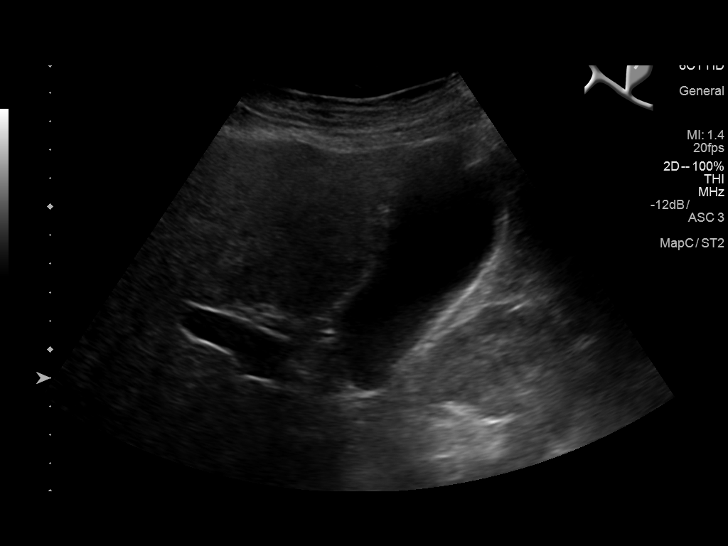
[im 8/46]
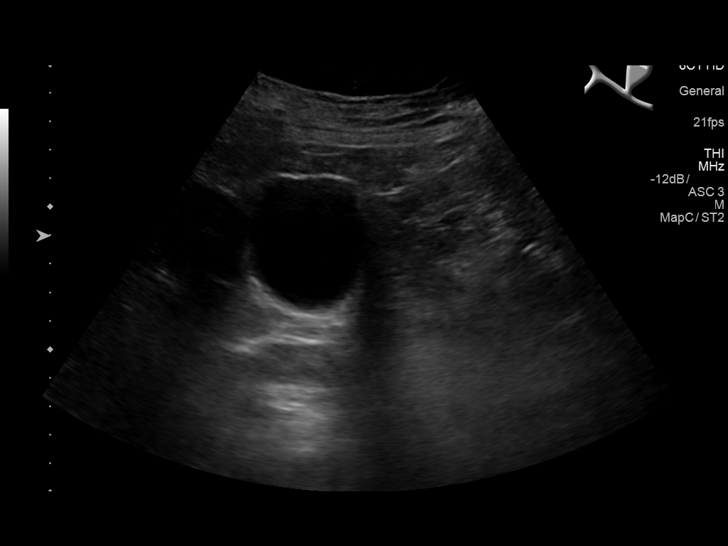
[im 12/46]
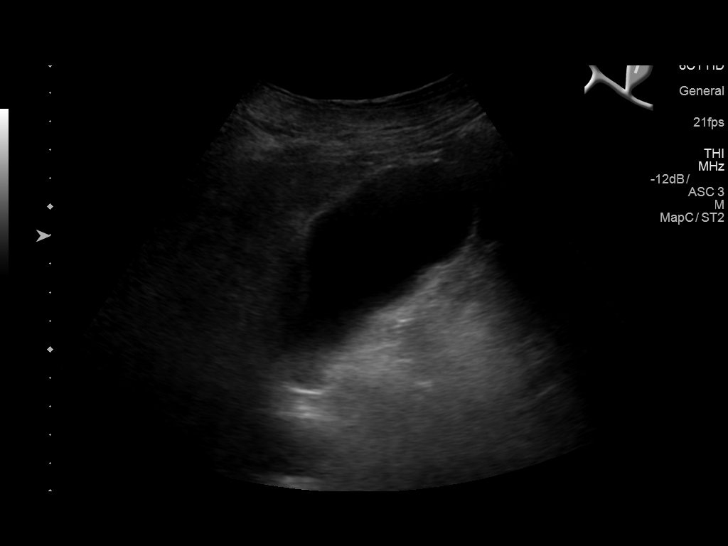
[im 16/46]
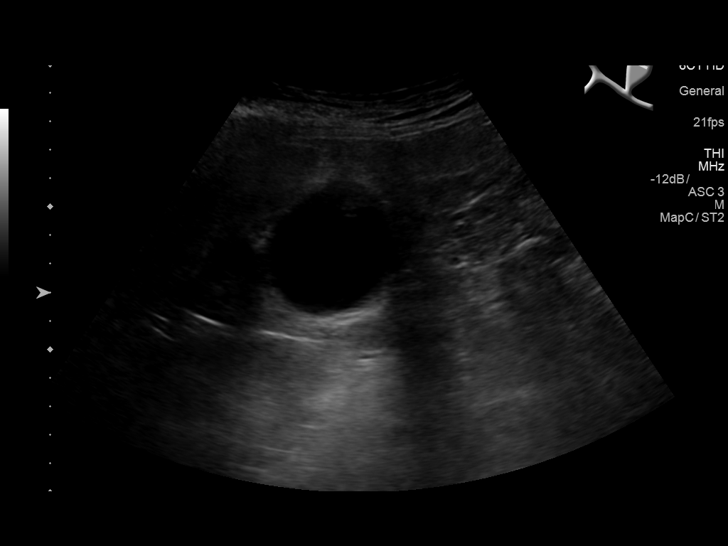
[im 17/46]
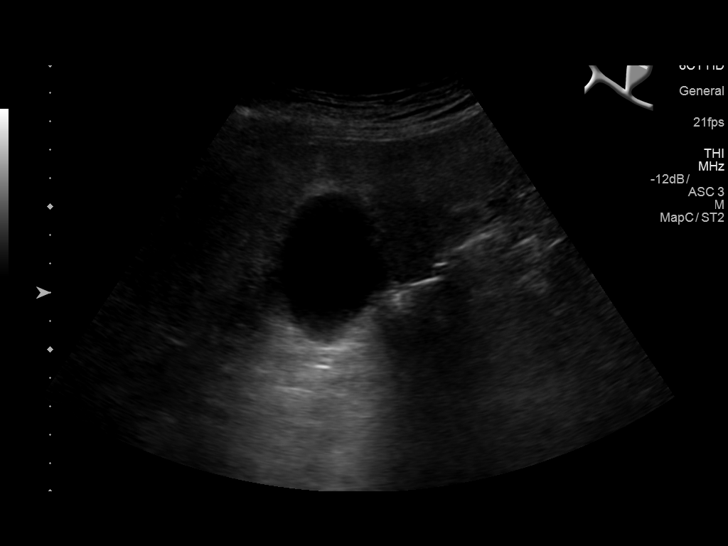
[im 21/46]
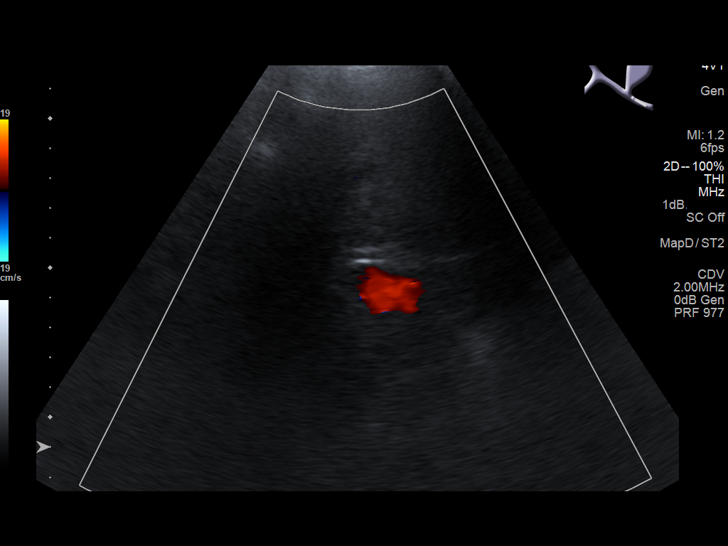
[im 25/46]
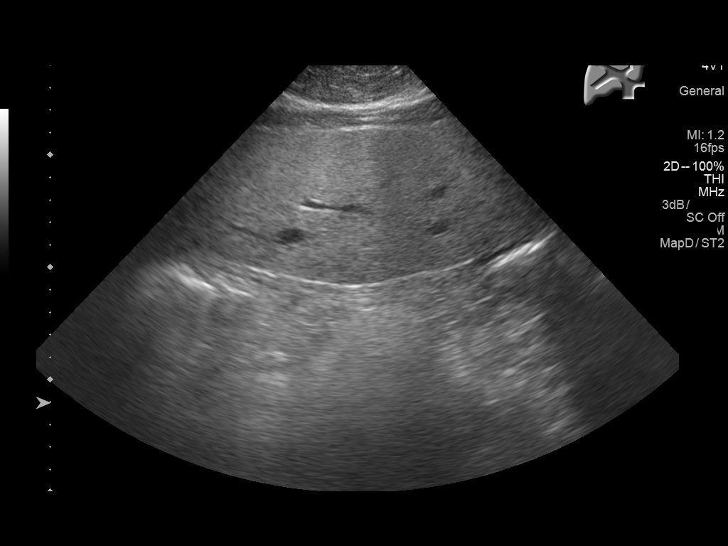
[im 29/46]
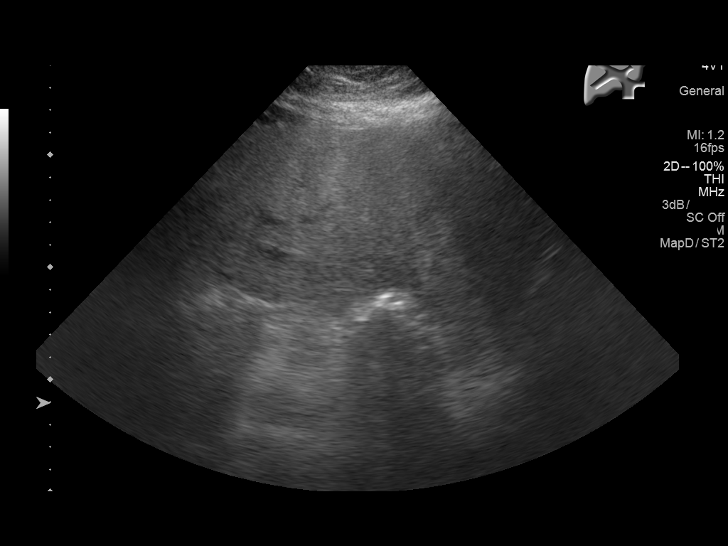
[im 31/46]
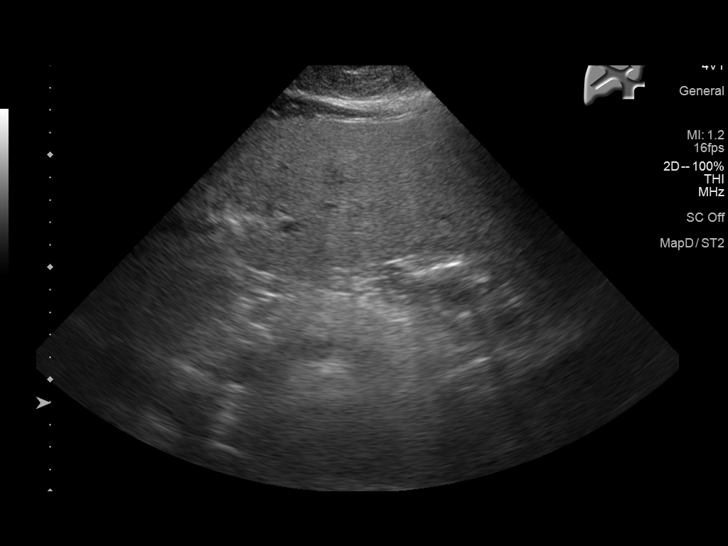
[im 34/46]
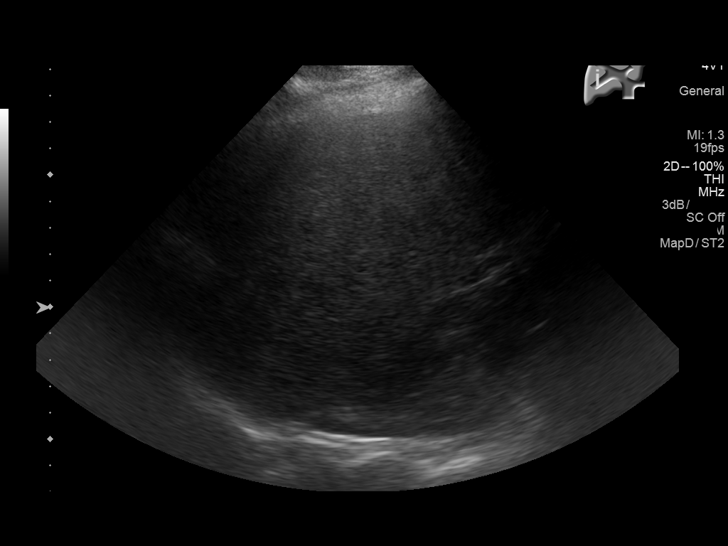
[im 38/46]
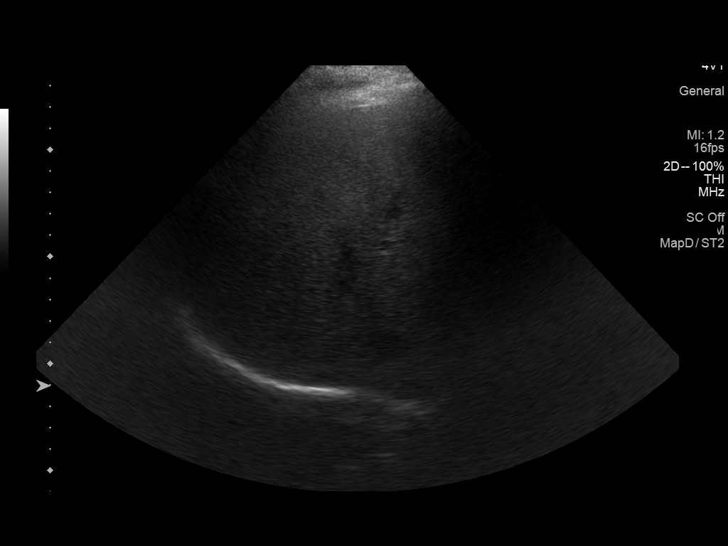
[im 42/46]
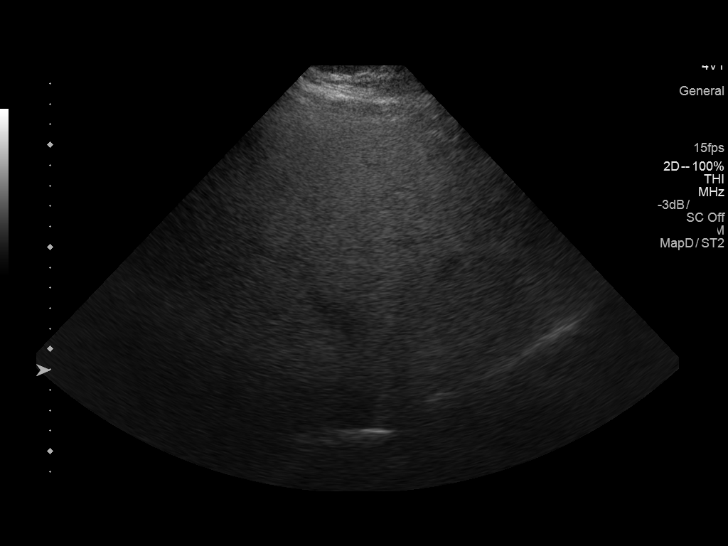
[im 46/46]
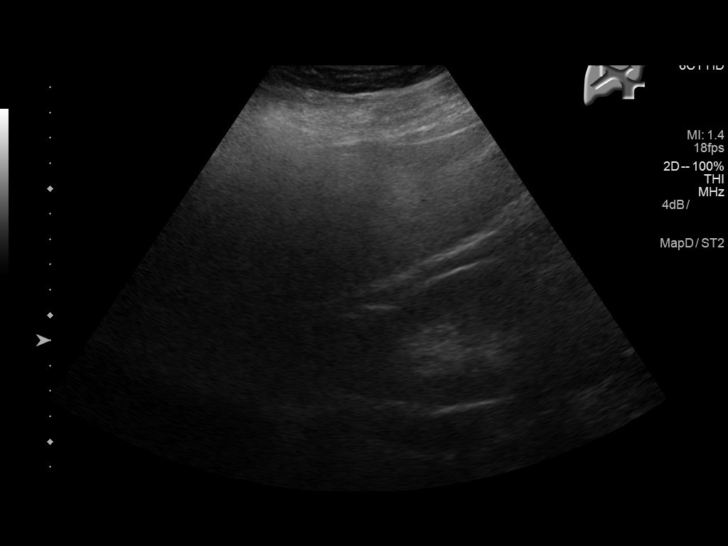

[14 of 25 positions shown; findings below may reference images not displayed]

FINDINGS: Gallbladder:

No gallstones or wall thickening visualized. Small volume of
organized sludge. No sonographic Murphy sign noted by sonographer.

Common bile duct:

Diameter:  3 mm

Liver:

No focal lesion identified. Diffusely mildly increased parenchymal
echogenicity.
IMPRESSION: Mild diffuse hepatic parenchymal disease suggesting possibility of
steatosis. Small volume of sludge in the gallbladder.

## 2017-08-23 IMAGING — US US ABDOMEN COMPLETE
1 series · 12 of 25 positions shown · non-contrast
Comparison: Right upper quadrant ultrasound on 08/04/2015 and chest
CT on 12/24/2015

CLINICAL DATA: Elevated liver function tests.  Cirrhosis.



[Series 1: us abdomen complete · 0.25mm/px · 12 of 240 slices shown]
[im 10/240]
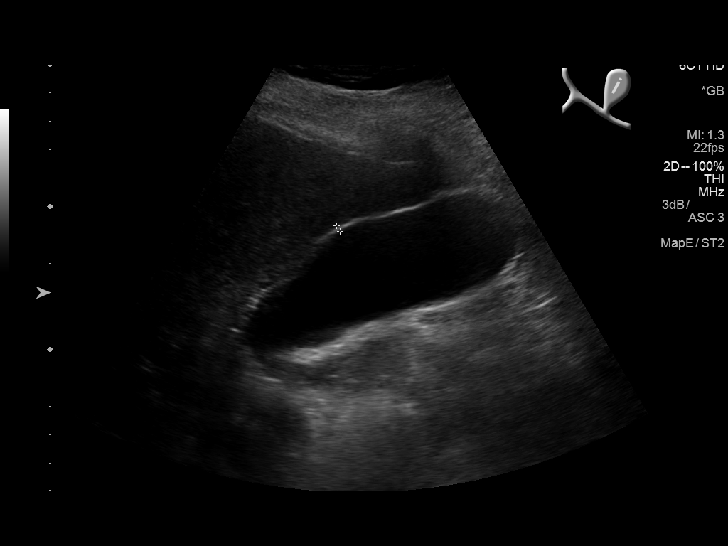
[im 30/240]
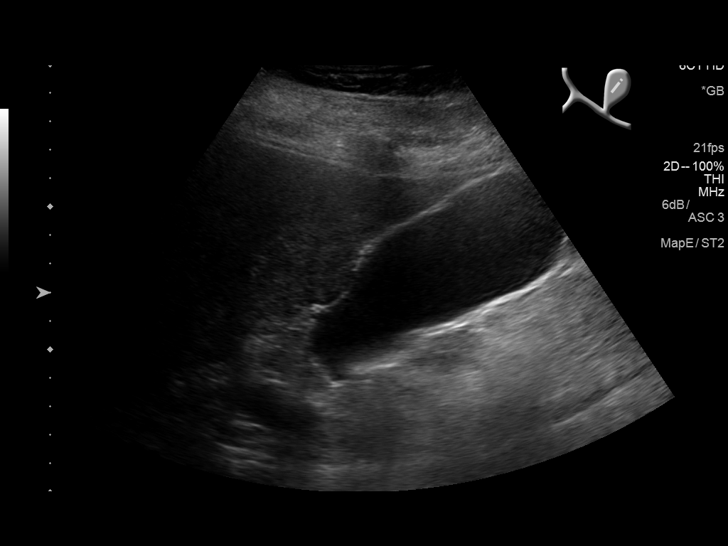
[im 50/240]
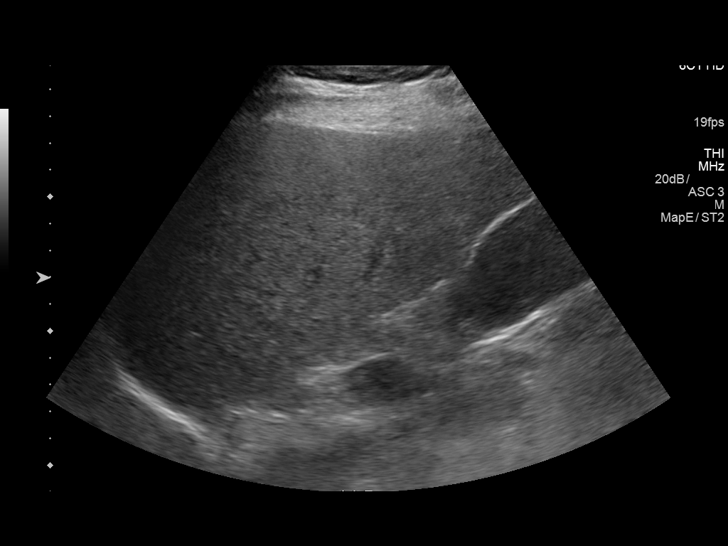
[im 70/240]
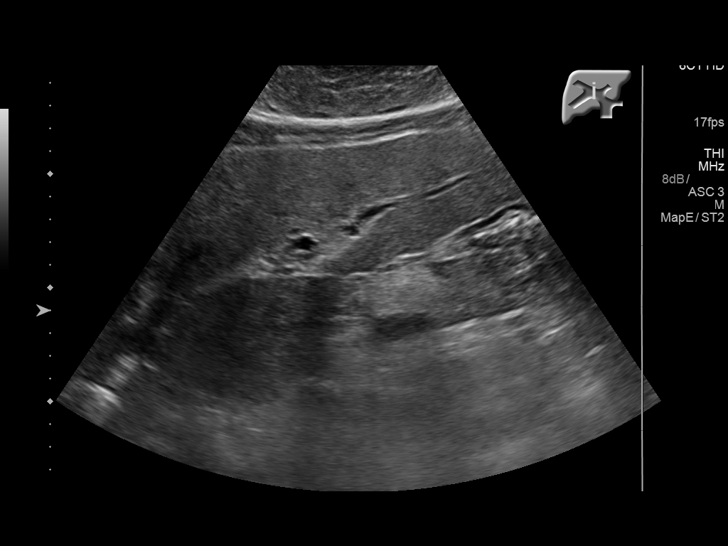
[im 90/240]
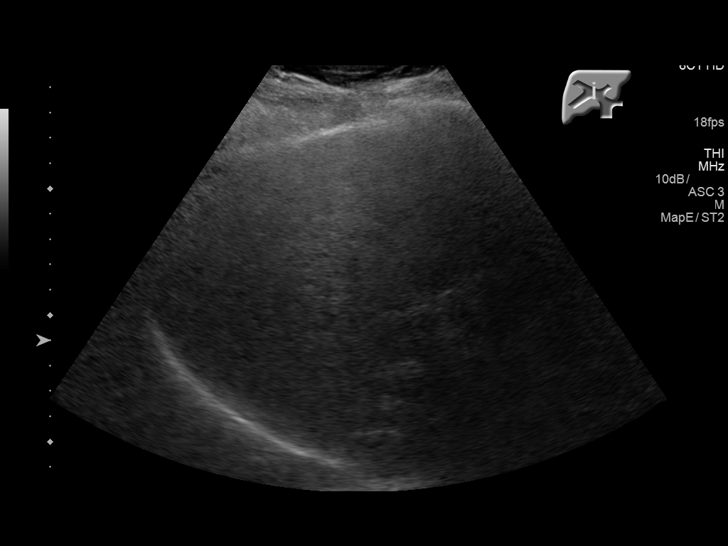
[im 110/240]
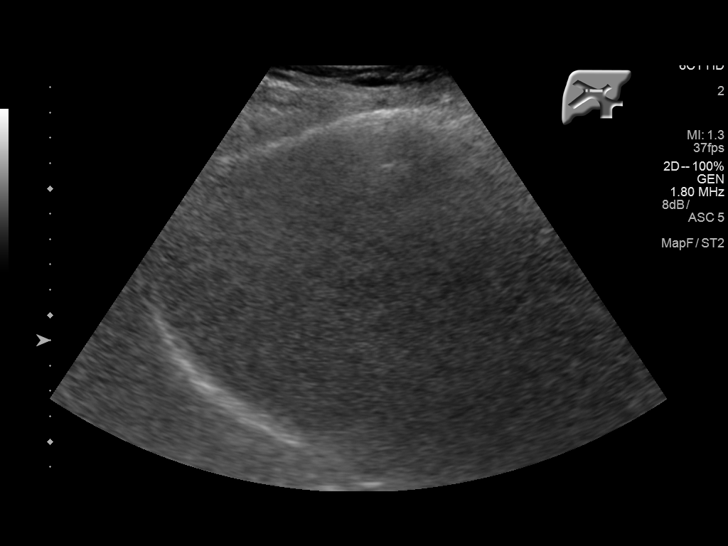
[im 130/240]
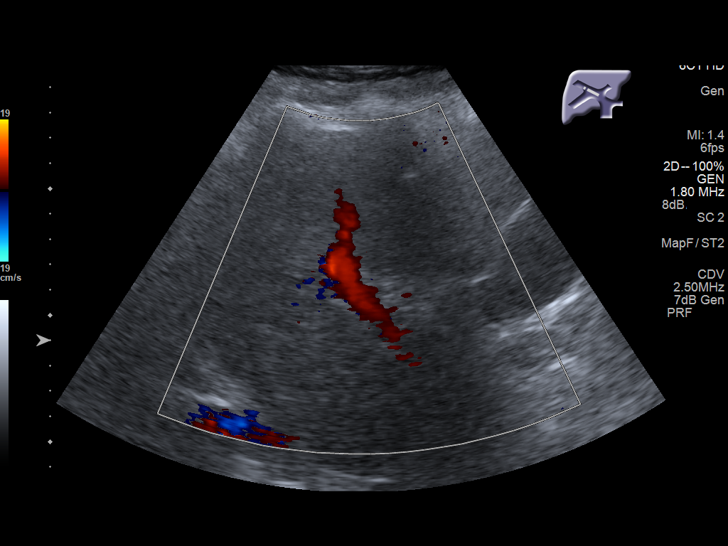
[im 150/240]
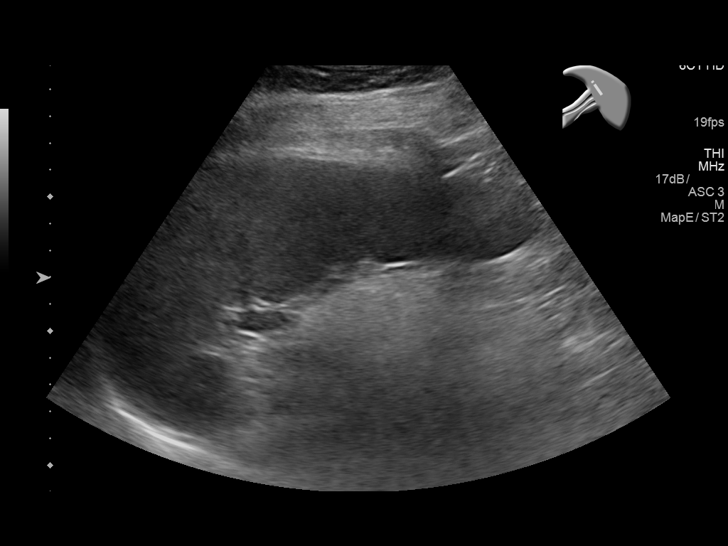
[im 170/240]
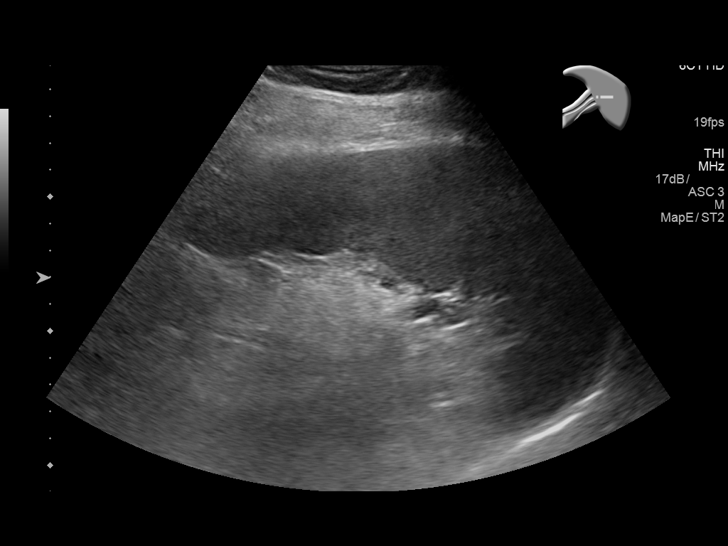
[im 190/240]
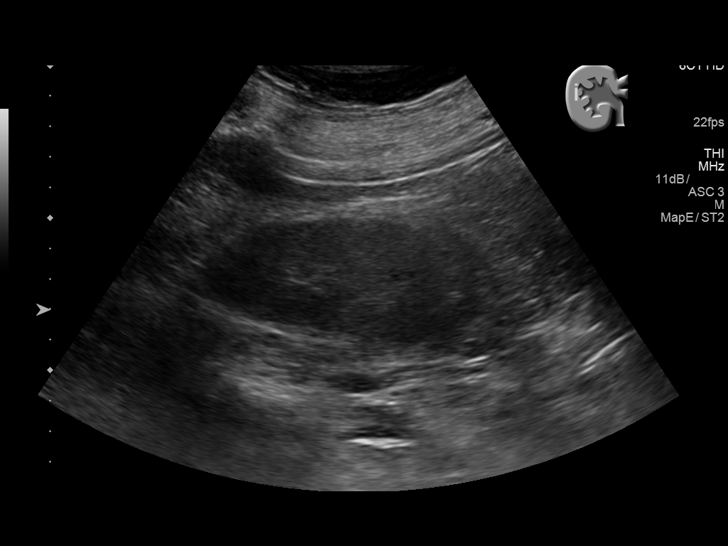
[im 210/240]
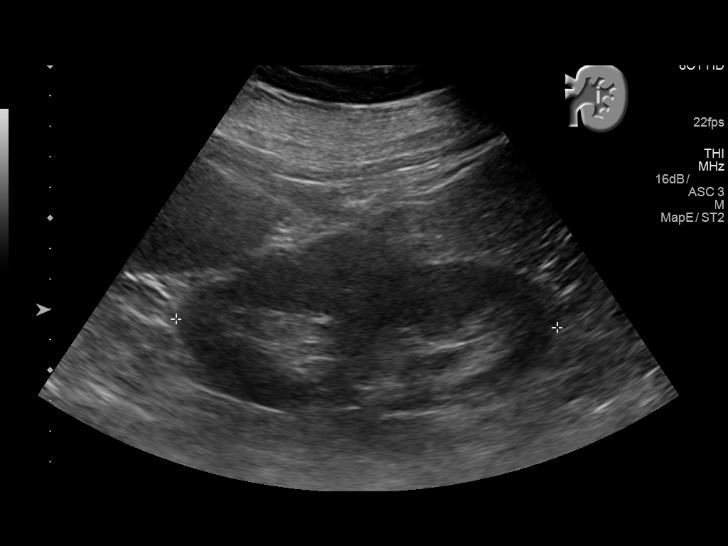
[im 230/240]
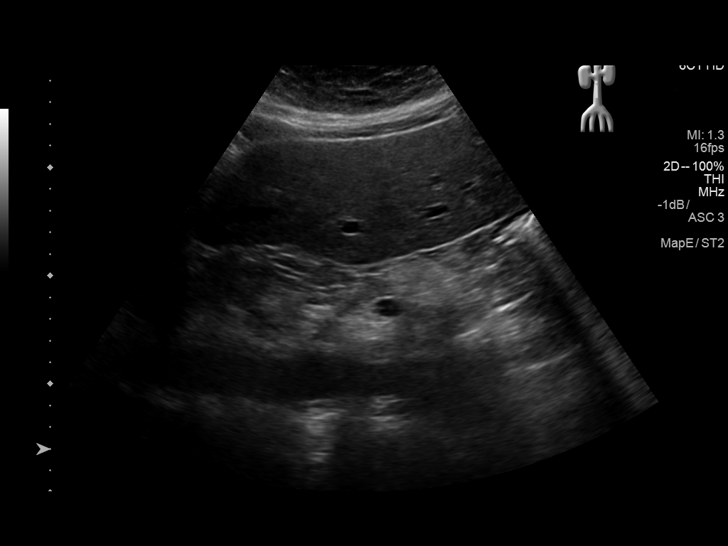

[12 of 25 positions shown; findings below may reference images not displayed]

FINDINGS: ULTRASOUND ABDOMEN

Gallbladder: No gallstones or wall thickening visualized. No
sonographic Murphy sign noted by sonographer.

Common bile duct: Diameter: 5 mm, within normal limits

Liver: Mildly increased coarse hepatic echotexture noted with
suggestion of capsular nodularity, suspicious for hepatic cirrhosis.
No liver mass visualized. Portal vein is patent.

IVC: No abnormality visualized.

Pancreas: Visualized portion unremarkable.

Spleen: Moderate splenomegaly, with length of 16 cm and estimated
volume of 843 mL. No splenic lesions identified.

Right Kidney: Length: 12.1 cm. Echogenicity within normal limits. No
mass or hydronephrosis visualized.

Left Kidney: Length: 12.5 cm. Echogenicity within normal limits. No
mass or hydronephrosis visualized.

Abdominal aorta:  No aneurysm visualized.

Other findings: None.

ULTRASOUND HEPATIC ELASTOGRAPHY

Device: Siemens Helix VTQ

Patient position: Left Lateral Decubitus

Transducer [id number]

Number of measurements: Multiple obtained at several different sites

Hepatic segment:  Several sites in segment 8 and 7

Median shear wave velocity: No valid velocities were obtained at
multiple attempted sites

Limitations of exam: Large patient habitus and respiratory motion

Pertinent findings noted on other imaging exams: Cirrhotic
configuration of liver on recent chest CT.

Please note that abnormal shear wave velocities may also be
identified in clinical settings other than with hepatic fibrosis,
such as: acute hepatitis, elevated right heart and central venous
pressures including use of beta blockers, T[place]acy[place]an[place] disease
(Ravinder), infiltrative processes such as
mastocytosis/amyloidosis/infiltrative tumor, extrahepatic
cholestasis, in the post-prandial state, and liver transplantation.
Correlation with patient history, laboratory data, and clinical
condition recommended.
IMPRESSION: ULTRASOUND ABDOMEN:
No evidence of gallstones or biliary ductal dilatation.

Findings consistent with hepatic cirrhosis, as demonstrated on
recent chest CT. No liver mass demonstrated.

Moderate splenomegaly, suspicious for portal venous hypertension. No
evidence of ascites.

ULTRASOUND HEPATIC ELASTOGRAPHY:

Nondiagnostic hepatic elastography as valid shear wave velocities
could not be obtained.
